# Patient Record
Sex: Female | Born: 1975 | Race: Black or African American | Hispanic: No | Marital: Single | Smoking: Never smoker
Health system: Southern US, Community
[De-identification: ages and names within clinical notes are randomized; demographics above are authoritative.]

## PROBLEM LIST (undated history)

## (undated) DIAGNOSIS — E7800 Pure hypercholesterolemia, unspecified: Secondary | ICD-10-CM

## (undated) DIAGNOSIS — E119 Type 2 diabetes mellitus without complications: Secondary | ICD-10-CM

## (undated) DIAGNOSIS — I10 Essential (primary) hypertension: Secondary | ICD-10-CM

## (undated) DIAGNOSIS — F419 Anxiety disorder, unspecified: Secondary | ICD-10-CM

## (undated) DIAGNOSIS — R509 Fever, unspecified: Secondary | ICD-10-CM

## (undated) DIAGNOSIS — E559 Vitamin D deficiency, unspecified: Secondary | ICD-10-CM

## (undated) DIAGNOSIS — G4730 Sleep apnea, unspecified: Secondary | ICD-10-CM

## (undated) DIAGNOSIS — G459 Transient cerebral ischemic attack, unspecified: Secondary | ICD-10-CM

## (undated) DIAGNOSIS — N946 Dysmenorrhea, unspecified: Secondary | ICD-10-CM

## (undated) HISTORY — PX: REDUCTION MAMMAPLASTY: SUR839

## (undated) HISTORY — PX: TUBAL LIGATION: SHX77

## (undated) HISTORY — PX: CHOLECYSTECTOMY: SHX55

---

## 2000-03-25 ENCOUNTER — Encounter: Admission: RE | Admit: 2000-03-25 | Discharge: 2000-03-25 | Payer: Self-pay | Admitting: *Deleted

## 2000-03-25 ENCOUNTER — Encounter: Payer: Self-pay | Admitting: *Deleted

## 2002-03-27 ENCOUNTER — Emergency Department (HOSPITAL_COMMUNITY): Admission: EM | Admit: 2002-03-27 | Discharge: 2002-03-27 | Payer: Self-pay | Admitting: Emergency Medicine

## 2002-03-27 ENCOUNTER — Encounter: Payer: Self-pay | Admitting: Emergency Medicine

## 2004-01-03 ENCOUNTER — Encounter: Admission: RE | Admit: 2004-01-03 | Discharge: 2004-02-01 | Payer: Self-pay | Admitting: *Deleted

## 2004-02-20 ENCOUNTER — Inpatient Hospital Stay (HOSPITAL_COMMUNITY): Admission: AD | Admit: 2004-02-20 | Discharge: 2004-02-23 | Payer: Self-pay | Admitting: *Deleted

## 2004-04-04 ENCOUNTER — Other Ambulatory Visit: Admission: RE | Admit: 2004-04-04 | Discharge: 2004-04-04 | Payer: Self-pay | Admitting: Obstetrics and Gynecology

## 2004-05-07 ENCOUNTER — Ambulatory Visit (HOSPITAL_COMMUNITY): Admission: RE | Admit: 2004-05-07 | Discharge: 2004-05-07 | Payer: Self-pay | Admitting: Obstetrics and Gynecology

## 2006-10-24 ENCOUNTER — Inpatient Hospital Stay (HOSPITAL_COMMUNITY): Admission: EM | Admit: 2006-10-24 | Discharge: 2006-10-27 | Payer: Self-pay | Admitting: Emergency Medicine

## 2006-10-26 ENCOUNTER — Encounter (INDEPENDENT_AMBULATORY_CARE_PROVIDER_SITE_OTHER): Payer: Self-pay | Admitting: Specialist

## 2006-10-29 ENCOUNTER — Ambulatory Visit: Payer: Self-pay | Admitting: Internal Medicine

## 2008-09-19 ENCOUNTER — Emergency Department (HOSPITAL_COMMUNITY): Admission: EM | Admit: 2008-09-19 | Discharge: 2008-09-19 | Payer: Self-pay | Admitting: Emergency Medicine

## 2010-12-14 NOTE — Op Note (Signed)
[name], Jennifer Bean              ACCOUNT NO.:  [id number]   MEDICAL RECORD NO.:  [id number]          PATIENT TYPE:  AMB   LOCATION:  SDC                           FACILITY:  [facility]   PHYSICIAN:  Duke Salvia. Marcelle Overlie, M.D.DATE OF BIRTH:  [birth year]   DATE OF PROCEDURE:  05/07/2004  DATE OF DISCHARGE:                                 OPERATIVE REPORT   PREOPERATIVE DIAGNOSIS:  Requests permanent sterilization.   POSTOPERATIVE DIAGNOSIS:  Requests permanent sterilization.   PROCEDURE:  Open laparoscopy, Filshie clip application.   SURGEON:  Duke Salvia. Marcelle Overlie, M.D.   ANESTHESIA:  General endotracheal.   COMPLICATIONS:  None.   DRAINS:  In-and-out Foley catheter.   ESTIMATED BLOOD LOSS:  Less than 5 mL.   PROCEDURE AND FINDINGS:  The patient was taken to the operating room.  After  an adequate level of general endotracheal anesthesia was obtained with the  patient's legs in stirrups, the abdomen, perineum, and vagina were prepped  and draped in the usual manner for laparoscopy.  The bladder was drained,  EUA carried out.  The uterus was midposition, upper limits of normal size.  Adnexa negative.  A Hulka tenaculum was positioned, attention directed to  the abdomen, where 0.25% Marcaine plain was infiltrated in the subumbilical  area.  This patient had a prior umbilical hernia repair as a child.  The  subumbilical area was tented up between two Allis clamps.  A small incision  was made.  Hemostats were then used to isolate the fascia using [employer]  retractors.  This was entered and separated until the peritoneum could be  identified, which was entered sharply without difficulty.  The open  laparoscopic cannula was inserted and insufflation was then carried out with  excellent visualization.  The patient placed in Trendelenburg, the pelvic  contents as follows.   The uterus was still upper limit of normal size.  Both ovaries were  unremarkable, as were the tubes.  The cul-de-sac  was free and clear.  Marcaine 0.25% plain 4-5 mL was then dripped across the tube from the cornu  to the fimbriated end.  The Filshie clip applicator was back loaded, a clip  was applied at a right angle 2 cm from the cornu on the right side with  excellent application.  The exact same repeated on the opposite side.  After  the clips were removed, the areas were photo documented and reinspected.  The tubes had been traced out to the fimbriated end on either side for  positive identification prior to clip application.  These areas were  hemostatic, instruments removed, gas allowed to escape.  The fascia closed  with 2-0 Vicryl interrupted sutures, a 4-0 Vicryl Rapide used on the skin in  subcuticular fashion.  She tolerated this well and went to the recovery room  in good condition.      RMH/MEDQ  D:  05/07/2004  T:  05/07/2004  Job:  [id number]

## 2010-12-14 NOTE — Discharge Summary (Signed)
NAMEANDEE, Jennifer Bean              ACCOUNT NO.:  [id number]   MEDICAL RECORD NO.:  [id number]          PATIENT TYPE:  INP   LOCATION:  5727                         FACILITY:  [facility]   PHYSICIAN:  Wilhemina Bonito. Marina Goodell, MD      DATE OF BIRTH:  1976-03-19   DATE OF ADMISSION:  10/24/2006  DATE OF DISCHARGE:  10/27/2006                               DISCHARGE SUMMARY   ADMITTING DIAGNOSES:  1. Biliary colic with gallstone pancreatitis and cholelithiasis.  Rule      out choledocholithiasis.  2. Gravida 4, para 4, all vaginal deliveries.  3. Status post umbilical hernia repair at age 46.  43. Status post tubal ligation around 2006.  5. Obesity.   DISCHARGE DIAGNOSES:  1. Biliary pancreatitis.  Symptoms resolved quickly.  2. Status post laparoscopic cholecystectomy with intraoperative      cholangiogram.  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   CONSULTATIONS:  1. With Dr. Cyndia Bent for general surgery.   PROCEDURES:  Laparoscopic cholecystectomy with intraoperative  cholangiogram.  The patient did not undergo an ERCP during this  admission.   BRIEF HISTORY:  Jennifer Bean is a pleasant 35 year old African American  female who presented to the [facility] emergency room, because of 5 days  of progressive epigastric and right upper quadrant pain associated with  nausea and dry heaves.  She had had at least three other episodes  similar to this, but they resolved after just a day or 2-3 days.  Symptoms were refractory to home use of antacids, and she came to the  emergency room for further evaluation.  A ultrasound was obtained that  showed gallstones and a common bile duct of 7 mm.  Her total bilirubin  was 3.9, alkaline phosphatase was 145, AST was 156 and ALT was 552.  Her  lipase was 3058 in the emergency room.  White count was normal, and a  basic metabolic profile was essentially normal, except for an elevated  glucose at 123.   The patient was  evaluated by [facility] and admitted to Dr. Verlee Monte  Brodie's service for observation and possible need for ERCP and  ultimately for surgical referral to have her gallbladder removed.  The  patient was stable and afebrile in the emergency room, and belly pain  was significantly improved following administration of Dilaudid.   LABS:  Sodium 139, potassium 3.9, chloride 105.  Carbon dioxide 29.  Glucose ranging 107-135.  BUN 5, creatinine 1.0.  Hemoglobin 15.1,  hematocrit 45.8.  White blood cell count 6.6.  MCV 81.  Platelets 335.  The GFR was consistently greater than 60.  Total bilirubin initially 3.9  on final assay.  Three days later it was 1.2.  Lipase went from 353,058-  55.  Alkaline phosphatase went from 145-106.  AST went from 156-63.  ALT  went from 552-302.  Albumin at discharge 3.1.  Total protein 6.0 at  discharge.  Urinalysis was an essentially contaminated specimen with  many epithelial cells present, but only 3-6 white blood cells, 0-2 red  blood cells and a few  bacteria.  She did have a few Trichomonas present  on urinalysis.   IMAGING STUDIES:  Ultrasound of the abdomen and of October 19, 2006 showed  cholelithiasis but no evidence for cholecystitis.  The common bile duct  was 7 mm, representing a mild borderline dilation of the common duct.  The pancreas imaging was limited by bowel gas.  There was no ascites.  Intraoperative cholangiogram showed no evidence for filling defect or  stricture.   HOSPITAL COURSE:  1. Cholelithiasis.  Patient was tolerating solid food at the time of      discharge.  2. Question of choledocholithiasis.  Her LFTs were initially elevated,      raising the question of the presence of a common bile duct stone.      However, on serial assay, the LFTs came close to normalizing, and      it was felt that she had probably passed a common bile duct stone.      Her symptomatology of abdominal pain and nausea/vomiting did      improve, although she was  a bit sore following the surgery.  3. Hyperglycemia.  The patient is obese.  Her blood sugars were      elevated.  We did not check a hemoglobin A1c, but in future it may      be warranted to check this to measure this, as she certainly looks      to be at risk for developing type 2 diabetes mellitus.  4. Biliary pancreatitis.  This was evidenced by the elevated lipase,      when she got to the emergency room.  Pancreas was not well      visualized on ultrasound.  Because her symptoms improved so      rapidly, it was not felt necessary to pursue a CT scan of the      abdomen to visualize the pancreas.  The lipase rapidly normalized.      Cause of the pancreatitis was certainly a gallstone.      Intraoperative cholangiogram again did show no evidence for      retained stones or sludge.   CONDITION ON DISCHARGE:  Stable and improved.   MEDICATIONS:  At discharge:  1. Tylenol one p.o. q.4 h as needed for pain.  The patient was advised      to call Dr. Tenna Child office and was given his phone number.  She      was to set up by a return to the office for 3 weeks from discharge.      She did not need to follow up with GI.   DIET:  Regular.  She is to avoid concentrated sweets.      Jennifer Moccasin, PA-C      Wilhemina Bonito. Marina Goodell, MD  Electronically Signed    SG/MEDQ  D:  10/27/2006  T:  10/27/2006  Job:  [id number]   cc:   Currie Paris, M.D.  Hedwig Morton. Juanda Chance, MD

## 2010-12-14 NOTE — Consult Note (Signed)
NAMEMARYTZA, Jennifer Bean              ACCOUNT NO.:  [id number]   MEDICAL RECORD NO.:  [id number]          PATIENT TYPE:  INP   LOCATION:  1826                         FACILITY:  [facility]   PHYSICIAN:  Currie Paris, M.D.DATE OF BIRTH:  [birth year]   DATE OF CONSULTATION:  10/24/2006  DATE OF DISCHARGE:                                 CONSULTATION   REQUESTING PHYSICIAN:  Everardo Beals. Juanda Chance, MD   SURGEON:  Currie Paris, M.D.   REASON FOR CONSULTATION:  Choledocholithiasis with associated biliary  pancreatitis.   HISTORY OF PRESENT ILLNESS:  Jennifer Bean is a 35 year old female patient  with a history of episodic right upper quadrant, abdominal pain x3  episodes; the first episode was in October 2007.  At that time the  duration of those symptoms were 2 days.  The onset of the symptoms were  nocturnal in nature; and awakened the patient from sleep.  She describes  the pain as being 6/10.  She received eventual relief from this pain  after taking Mylicon, Rolaids, and Coca-Cola.  Her most recent symptoms  started Monday, of this week; and by yesterday evening, Thursday night,  the patient had very severe symptoms; was unable to sleep; and was  unable to position self comfortably.  She presented to the ER this  morning because pain continued.  An ultrasound demonstrated gallstones  without acute cholecystitis, borderline ductal dilatation, with a  question of a distal common bile duct stone.  Labs revealed  transaminitis with a total bilirubin of 3.9.  LFTs were also elevated.  Total bilirubin elevated at 3.9 and AST and ALT were also elevated.  Lipase was 3058.  Gastroenterology was called to see the patient; and  they have admitted the patient with a diagnosis of biliary pancreatitis.  Because of the underlying gallstones, surgical evaluation has been  requested.   REVIEW OF SYSTEMS:  The patient has no experiencing nausea and vomiting  at home.  No fevers, no diarrhea.   SOCIAL HISTORY:  No alcohol.  No tobacco. No illegal drugs.  She works  as a Art gallery manager.   PAST MEDICAL HISTORY:  None.   PAST SURGICAL HISTORY:  Tubal ligation and an umbilical hernia repair at  age 57.   ALLERGIES:  NKDA.   CURRENT MEDICATIONS:  None at home.  Since admission she had been  started on Dilaudid, Phenergan, Unasyn, and Vistaril.   PHYSICAL EXAMINATION:  GENERAL:  Female patient with complaints of  nausea and severe right upper quadrant abdominal pain.  VITAL SIGNS:  Temperature 97.5, BP 132/88, pulse 70 and regular,  respirations 20.  NEURO:  The patient is alert and oriented x3 moving all extremities x4  without focal deficits.  HEENT:  Head normocephalic sclera noninjected.  NECK:  Supple.  No adenopathy.  CHEST:  She has bilateral lung sounds, clear to auscultation,  respiratory effort is nonlabored.  She is on room air.  CARDIAC:  S1-S2 no rubs, murmurs, thrills, or gallops.  No JVD, pulse  regular.  ABDOMEN:  Obese and soft, tender, somewhat in the right lower quadrant;  and  more so in the right upper quadrant which is exquisite in nature  with guarding and no rebounding.  EXTREMITIES:  Symmetrical in appearance without edema, cyanosis, or  clubbing.   LAB:  White count 6600, neutrophils 63%, hemoglobin 15.1, platelets  335,000.  Sodium 137, potassium 3.9, CO2 26, glucose 123, BUN 5,  creatinine 1.0, total bilirubin 3.9, alkaline phosphatase 165, AST 156,  ALT 552, lipase 3058.   DIAGNOSTICS:  Ultrasound of the abdomen is noted.   IMPRESSION:  1. Biliary colic with associated cholelithiasis.  2. Choledocholithiasis and associated transaminitis.  3. Biliary pancreatitis.  4. Nausea, vomiting, and volume depletion.   PLAN:  1. Agree with allowing clears, n.p.o. if lipase increases or diet      causes increased pain.  Agree also with IV fluids, empiric Unasyn,      and pain management.  2. The patient is having frequent breakthrough pain  despite Dilaudid      dosage ordered.  We will go ahead and change her Dilaudid to PCA      and add Toradol IV, Protonix while on Toradol.  3. Check CMET, CBC, and lipase in the morning.  GI is also checking      labs this evening.  4. Surgical a GI team need to discuss timing of surgical intervention      We usually prefer to allow pancreatitis to cool down, before      proceeding.  Obviously if total bilirubin begins to increase and/or      lipase increases with increasing pain, then ERCP may be indicated      first.      Revonda Standard L. Rennis Harding, N.P.      Currie Paris, M.D.  Electronically Signed    ALE/MEDQ  D:  10/24/2006  T:  10/24/2006  Job:  [id number]

## 2010-12-14 NOTE — H&P (Signed)
Jennifer Bean, Jennifer Bean              ACCOUNT NO.:  [id number]   MEDICAL RECORD NO.:  [id number]          PATIENT TYPE:  AMB   LOCATION:  SDC                           [facility]   PHYSICIAN:  Duke Salvia. Marcelle Overlie, M.D.DATE OF BIRTH:  12-05-1975   DATE OF ADMISSION:  05/07/2004  DATE OF DISCHARGE:                                HISTORY & PHYSICAL   CHIEF COMPLAINT:  Request permanent sterilization.   HISTORY OF PRESENT ILLNESS:  A 35 year old, G4 now P4 is now 9 weeks  postpartum who presents for LTC.  The permanence of the procedure, failure  rate of 2-09/998, other risks relative to bleeding, infection, adjacent  organ injury and the possible need for open or additional surgery all  discussed with her and she understands and accepts.   PAST MEDICAL HISTORY:  Allergies none.   OBSTETRIC AL HISTORY:  Four vaginal deliveries at term, the last was  complicated by insulin dependent diabetes with good control.   REVIEW OF SYMPTOMS:  Significant for history of leiomyoma, umbilical hernia  repair at age 9.   FAMILY HISTORY:  Significant for a sister with chronic hypertension  otherwise negative.   PHYSICAL EXAMINATION:  VITAL SIGNS:  Temperature 98.2, blood pressure  120/82.  HEENT:  Unremarkable.  NECK:  Supple without masses.  LUNGS:  Clear.  CARDIOVASCULAR:  Regular rate and rhythm without murmurs, rubs or gallops.  BREASTS:  Without masses.  ABDOMEN:  Soft, flat and nontender.  PELVIC:  Normal external genitalia, __________ cervix clear.  A Pap was done  which was class 1. She was recently treated with a 2 g stat dose of Flagyl  for Trichomonas noted on a Pap smear. Adnexa otherwise unremarkable.  EXTREMITIES:  Unremarkable.  NEUROLOGIC:  Unremarkable.   IMPRESSION:  Request permanent sterilization.   PLAN:  Tubal ligation by Filshie clip __________, open laparoscopy.  The  procedure and risks reviewed as above.      RMH/MEDQ  D:  05/04/2004  T:  05/04/2004  Job:   [id number]

## 2010-12-14 NOTE — Op Note (Signed)
Jennifer Bean, Jennifer Bean              ACCOUNT NO.:  [id number]   MEDICAL RECORD NO.:  [id number]          PATIENT TYPE:  INP   LOCATION:  5727                         FACILITY:  [facility]   PHYSICIAN:  Currie Paris, M.D.DATE OF BIRTH:  [birth year]   DATE OF PROCEDURE:  10/26/2006  DATE OF DISCHARGE:                               OPERATIVE REPORT   PREOPERATIVE DIAGNOSIS:  Chronic calculus cholecystitis with a recent  biliary pancreatitis.   POSTOPERATIVE DIAGNOSIS:  Chronic calculus cholecystitis with a recent  biliary pancreatitis.   OPERATION:  Laparoscopic cholecystectomy with operative cholangiogram.   SURGEON:  Currie Paris, M.D.   ASSISTANT:  Ollen Gross. Vernell Morgans, M.D.   ANESTHESIA:  General.   CLINICAL HISTORY:  Jennifer Bean is a 35 year old lady who presented to the  emergency room and was admitted on 03/28 with acute biliary  pancreatitis.  Her symptoms and pancreatic function tests returned to  normal rather quickly.  She was found to have gallstones on her  ultrasound.  She elected to proceed to cholecystectomy today.   DESCRIPTION OF PROCEDURE:  The patient was seen in the holding area and  she had no further questions.  She was taken to the operating room and  after satisfactory general endotracheal anesthesia had been obtained the  abdomen was prepped and draped.  The time-out occurred.   Then 0.25% plain Marcaine was used for each incision.  The umbilical  incision was made, the fascia opened, and the peritoneal cavity entered.  A pursestring was placed; the Hasson introduced and the abdomen  insufflated to 15.  There were no gross abnormalities.  There is a  little edema around the gallbladder, but this was really minimal.  There  is no evidence of any acute pancreatitis-type changes such as  calcifications, etcetera.   The patient was put in reverse Trendelenburg and tilted to the left.  A  10/11 trocar was placed under direct vision in the epigastrium  and two 5  mm laterally. The gallbladder was grasped and retracted over the liver.  Multiple omental adhesions were taken down.  The perineum over the  triangle of Fallot was opened; and I could identify what appeared to be  the artery; and the duct.  With a long segment duct, I could see it  clearly entering into the gallbladder; and down towards the common duct,  although we did not dissect it too far down towards the common duct. I  put one clip on what appeared to be each branch of the artery; and  another clip on the cystic duct at the junction with the gallbladder.   The cystic duct was opened.  A Cook catheter was placed percutaneously  and placed into the cystic duct for cholangiography.  This was  accomplished and appeared normal with good filling of the duodenum and  common duct with no evidence of any filling defects.  The hepatic  radicals appeared normal.  The cystic duct catheter was removed; and  three clips placed on the stay side of the cystic duct.  Additional  clips were placed on the cystic arteries  and they were divided.  Another  branch of the artery, further up, was also clipped and divided.   The gallbladder was removed from below-to-above.  It was placed in a bag  and brought out the umbilical port. We rechecked for hemostasis; and  everything appeared to be dry.  The umbilical port site was closed with  the pursestring.  The lateral ports were removed.  The epigastric port  was used to decompress the abdomen and then removed.  Skin was closed  with 4-0 Monocryl subcuticular plus Dermabond.  The patient tolerated  the procedure well.  There no operative complications.  All counts were  correct.      Currie Paris, M.D.  Electronically Signed     CJS/MEDQ  D:  10/26/2006  T:  10/26/2006  Job:  [id number]   cc:   Hedwig Morton. Juanda Chance, MD

## 2010-12-14 NOTE — H&P (Signed)
Jennifer Bean, LISA              ACCOUNT NO.:  [id number]   MEDICAL RECORD NO.:  [id number]          PATIENT TYPE:  EMS   LOCATION:  MAJO                         FACILITY:  [facility]   PHYSICIAN:  Hedwig Morton. Juanda Chance, MD     DATE OF BIRTH:  Jan 29, 1976   DATE OF ADMISSION:  10/24/2006  DATE OF DISCHARGE:                              HISTORY & PHYSICAL   GYN PHYSICIANS:  Drs. Hoffman and Crawford in [place].   She has been seen as well at the [facility] for primary care  but is not actively cared for at that clinic currently.   CHIEF COMPLAINT:  Upper abdominal pain and nausea.   HISTORY OF PRESENT ILLNESS:  The patient is seen in the minor side of  the emergency room today because of a 5-day history of epigastric and  right upper quadrant pain.  She has had nausea and dry heaves.  She has  not had any fever or chills.  She is a 35 year old obese African  American female.   In the emergency room, her LFTs are elevated.  These will be listed  below.  Her lipase is elevated, also listed below.  An ultrasound is  showing gallstones.  Mild dilation of the bile duct at 7 mm and no  evidence for cholecystitis.  They do not see the pancreas well and do  not see stones in the bile duct.  Although she says she has been dry  heaving at home, during this interview she did proceed to have an  episode of bilious emesis.   At home, she has been on simethicone, Maalox, and Rolaids, with no  relief of her symptoms.  She does say that she has had a couple of these  episodes several months ago similar to this; one that lasted a couple of  days in the other lasted about 3 or 4 days but she did not seek medical  attention for these.   ALLERGIES:  None known.   MEDICATIONS:  Adipex, this is phentermine.  She has been using this  p.r.n. as a weight loss aid but she really is not using it much.  She  has not used it in the past couple weeks.   PAST MEDICAL HISTORY:  1. Gravida 4, para 4,  vaginal deliveries.  2. Status post umbilical hernia repair, age 78.  3. Status post tubal ligation 2 years ago.  4. Obesity.   SOCIAL HISTORY:  The patient is a single mother.  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  The patient lives with three  of her four children who are age 21 to 51 in [place].  The fourth  child lives with the father.  She drinks alcohol, maybe twice a month,  either a single [place] iced tea cocktail or two to three beers as a  maximum intake when she does drink.  She has not been drinking recently  within the last 10 days.   FAMILY HISTORY:  Her mom, as well as all of her sisters, which she said  were 3 or  4 sisters, have had gallbladder disease and had to have their  gallbladders removed.   REVIEW OF SYSTEMS:  She is experiencing pruritus on her limbs and trunk.  She denies bleeding disorder.  Her last menstrual period was in January  and her periods are irregular.  She denies dysmenorrhea.  Her weight is  relatively stable but she is overweight.  She denies history of  hypertension, headaches, or tingling or numbness.  UROLOGIC:  Urine is a  bit darker than usual.  Otherwise, her review of systems is negative.   LABORATORY:  Total bilirubin is 3.9, alkaline phosphatase is 145, AST  156, ALT 552.  Lipase is 3.058.  Sodium 137, potassium 3.9.  Chloride  104, CO2 26, BUN 9, creatinine 1.  Glucose 123.  Hemoglobin 15.1,  hematocrit 45.8.  White blood cell count 6.6.  Platelets 335,000.  The  MCV is 81.   EXAM:  Blood pressure 124/86, pulse 73, respirations 22, room air  saturation 98% on room air.  Weight has not yet been obtained.  The  patient is a pleasant, obese African-American female in no distress.  She did proceed to vomit bilious material during the exam.  HEENT EXAM:  Sclerae are nonicteric.  Conjunctivae are not pale.  Extraocular movements are intact.  Oropharynx is clear and moist.  NECK:  Is without JVD, masses or  thyromegaly.  PULMONARY EXAM:  Clear to auscultation and percussion bilaterally.  No  cough.  No shortness of breath.  CARDIOVASCULAR:  Regular rate and rhythm.  No murmurs, rubs or gallops.  GI EXAM:  Is with a soft abdomen, nontender, obese.  The epigastrium and  the right upper quadrant are mildly tender but no guarding or rebound.  Note that she has had a couple of mg of Dilaudid a couple of hours ago  and her pain has abated at the time of this exam.  There are no masses,  no hepatosplenomegaly.  NEUROLOGIC:  No tremor.  She moves all four.  PSYCHIATRIC:  The patient is pleasant and appropriate.  She is a good  historian.  She seems a little bit anxious.  DERMATOLOGIC:  There is no scleral icterus but she is [place] American  so this is difficult to detect.   ASSESSMENT:  Biliary colic with what looks like gallstone pancreatitis  and cholelithiasis, rule out choledocholithiasis.  This is actually a  third episode of similar symptoms.  She may have passed a gallstone  already.   PLAN:  The patient being admitted for antibiotics, IV fluids, and repeat  of serial labs.  Will allow her clear p.o. intake.  Possible ERCP in the  morning versus a CT scan in the morning, depending on her clinical  status.      [name], PA-C      Hedwig Morton. Juanda Chance, MD  Electronically Signed    SG/MEDQ  D:  10/24/2006  T:  10/24/2006  Job:  [id number]

## 2011-04-14 ENCOUNTER — Emergency Department (HOSPITAL_COMMUNITY)
Admission: EM | Admit: 2011-04-14 | Discharge: 2011-04-14 | Disposition: A | Discharge status: Home / Self Care | Payer: Medicaid Other | Attending: Emergency Medicine | Admitting: Emergency Medicine

## 2011-04-14 DIAGNOSIS — I10 Essential (primary) hypertension: Secondary | ICD-10-CM | POA: Insufficient documentation

## 2011-04-14 DIAGNOSIS — J069 Acute upper respiratory infection, unspecified: Secondary | ICD-10-CM | POA: Insufficient documentation

## 2012-07-29 DIAGNOSIS — G459 Transient cerebral ischemic attack, unspecified: Secondary | ICD-10-CM

## 2012-07-29 HISTORY — DX: Transient cerebral ischemic attack, unspecified: G45.9

## 2014-04-25 ENCOUNTER — Telehealth: Payer: Self-pay | Admitting: General Practice

## 2014-04-25 NOTE — Telephone Encounter (Signed)
[facility] Case worker called on behalf of pt to schedule a new patient appt.  Caseworker states pt called to schedule an appt but was told to call back in the morning for a new patient slot.  Case worker states pt was at [facility] over the weekend and needs to f/u, there is no encounter listed in chart since 2012 so asked to relay to pt to bring discharge paperwork/ any medical records she has with her to her appointment which would be scheduled next week on Tuesday (05/03/14).  Caseworker states pt needs to see a Dr this week preferably tomorrow (04/26/14) and that another clinic would probably be able to schedule her sooner. Caller was advised pt can call back if still needs appt.

## 2015-01-18 ENCOUNTER — Other Ambulatory Visit: Payer: Self-pay | Admitting: Internal Medicine

## 2015-01-18 DIAGNOSIS — Z1231 Encounter for screening mammogram for malignant neoplasm of breast: Secondary | ICD-10-CM

## 2015-01-23 ENCOUNTER — Ambulatory Visit
Admission: RE | Admit: 2015-01-23 | Discharge: 2015-01-23 | Disposition: A | Discharge status: Home / Self Care | Payer: Medicaid Other | Source: Ambulatory Visit | Attending: Internal Medicine | Admitting: Internal Medicine

## 2015-01-23 DIAGNOSIS — Z1231 Encounter for screening mammogram for malignant neoplasm of breast: Secondary | ICD-10-CM

## 2015-03-16 ENCOUNTER — Encounter (HOSPITAL_COMMUNITY): Payer: Self-pay | Admitting: Cardiology

## 2015-03-16 ENCOUNTER — Emergency Department (HOSPITAL_COMMUNITY)
Admission: EM | Admit: 2015-03-16 | Discharge: 2015-03-16 | Disposition: A | Discharge status: Home / Self Care | Payer: Medicaid Other | Attending: Emergency Medicine | Admitting: Emergency Medicine

## 2015-03-16 DIAGNOSIS — Y9289 Other specified places as the place of occurrence of the external cause: Secondary | ICD-10-CM | POA: Diagnosis not present

## 2015-03-16 DIAGNOSIS — Y998 Other external cause status: Secondary | ICD-10-CM | POA: Insufficient documentation

## 2015-03-16 DIAGNOSIS — H9192 Unspecified hearing loss, left ear: Secondary | ICD-10-CM | POA: Diagnosis not present

## 2015-03-16 DIAGNOSIS — Y9389 Activity, other specified: Secondary | ICD-10-CM | POA: Diagnosis not present

## 2015-03-16 DIAGNOSIS — H9202 Otalgia, left ear: Secondary | ICD-10-CM | POA: Diagnosis present

## 2015-03-16 DIAGNOSIS — T162XXA Foreign body in left ear, initial encounter: Secondary | ICD-10-CM

## 2015-03-16 DIAGNOSIS — X58XXXA Exposure to other specified factors, initial encounter: Secondary | ICD-10-CM | POA: Diagnosis not present

## 2015-03-16 DIAGNOSIS — I10 Essential (primary) hypertension: Secondary | ICD-10-CM | POA: Diagnosis not present

## 2015-03-16 HISTORY — DX: Essential (primary) hypertension: I10

## 2015-03-16 NOTE — Discharge Instructions (Signed)
Read the information below.  You may return to the Emergency Department at any time for worsening condition or any new symptoms that concern you.   Ear Foreign Body An ear foreign body is an object that is stuck in the ear. It is common for young children to put objects into the ear canal. These may include pebbles, beads, beans, and any other small objects which will fit. In adults, objects such as cotton swabs may become lodged in the ear canal. In all ages, the most common foreign bodies are insects that enter the ear canal.  SYMPTOMS  Foreign bodies may cause pain, buzzing or roaring sounds, hearing loss, and ear drainage.  HOME CARE INSTRUCTIONS   Keep all follow-up appointments with your caregiver as told.  Keep small objects out of reach of young children. Tell them not to put anything in their ears. SEEK IMMEDIATE MEDICAL CARE IF:   You have bleeding from the ear.  You have increased pain or swelling of the ear.  You have reduced hearing.  You have discharge coming from the ear.  You have a fever.  You have a headache. MAKE SURE YOU:   Understand these instructions.  Will watch your condition.  Will get help right away if you are not doing well or get worse. Document Released: 07/12/2000 Document Revised: 10/07/2011 Document Reviewed: 03/02/2008 [facility] Patient Information 2015 [place], [place]. This information is not intended to replace advice given to you by your health care provider. Make sure you discuss any questions you have with your health care provider.

## 2015-03-16 NOTE — ED Notes (Signed)
Pt reports that she thinks a piece of q-tip is stuck in her left ear.

## 2015-03-16 NOTE — ED Provider Notes (Signed)
CSN: [id number]     Arrival date & time 03/16/15  1157 History  This chart was scribed for non-physician practitioner Clayton Bibles, PA-C, working with Virgel Manifold, MD by Zola Button, ED Scribe. This patient was seen in room TR10C/TR10C and the patient's care was started at 1:05 PM.      Chief Complaint  Patient presents with  . Ear Pain   The history is provided by the patient. No language interpreter was used.   HPI Comments: Jennifer Bean is a 39 y.o. female who presents to the Emergency Department complaining of a possible piece of Q-tip in her left ear as of 2 hours ago. Patient reports having associated decreased hearing and ear fullness. She was cleaning her ears this morning and notes that a piece of the Q-tip did not come out, and she could not find the piece anywhere around her. PMHx includes hypertension.  Denies fevers, chills, nasal congestion, rhinorrhea, sore throat, headache, ear discharge, cough, SOB.    Past Medical History  Diagnosis Date  . Hypertension    History reviewed. No pertinent past surgical history. History reviewed. No pertinent family history. Social History  Substance Use Topics  . Smoking status: Never Smoker   . Smokeless tobacco: None  . Alcohol Use: Yes   OB History    No data available     Review of Systems  Constitutional: Negative for fever and chills.  HENT: Positive for ear pain and hearing loss. Negative for congestion, dental problem, ear discharge, facial swelling, mouth sores, rhinorrhea, sore throat and trouble swallowing.   Eyes: Negative for discharge.  Respiratory: Negative for cough, shortness of breath, wheezing and stridor.   Cardiovascular: Negative for chest pain.  Musculoskeletal: Negative for neck pain and neck stiffness.  Allergic/Immunologic: Negative for immunocompromised state.      Allergies  Review of patient's allergies indicates no known allergies.  Home Medications   Prior to Admission medications   Not  on File   BP 128/87 mmHg  Pulse 94  Temp(Src) 98.1 F (36.7 C) (Oral)  Resp 18  SpO2 99%  LMP 03/15/2015 Physical Exam  Constitutional: She appears well-developed and well-nourished. No distress.  HENT:  Head: Normocephalic and atraumatic.  Right Ear: Tympanic membrane and ear canal normal.  Left Ear: Tympanic membrane normal. A foreign body is present.  Single large foreign body consistent with Q-tip cotton in left ear canal.  Neck: Neck supple.  Pulmonary/Chest: Effort normal.  Neurological: She is alert.  Skin: She is not diaphoretic.  Nursing note and vitals reviewed.   ED Course  FOREIGN BODY REMOVAL Date/Time: 03/16/2015 1:46 PM Performed by: Clayton Bibles Authorized by: Clayton Bibles Consent: Verbal consent obtained. Consent given by: patient Patient identity confirmed: verbally with patient Body area: ear Location details: left ear Patient sedated: no Patient restrained: no Patient cooperative: yes Localization method: ENT speculum and visualized Removal mechanism: alligator forceps Complexity: simple 1 objects recovered. Objects recovered: Qtip cotton Post-procedure assessment: foreign body removed Patient tolerance: Patient tolerated the procedure well with no immediate complications Comments: Ear canal clear and TM normal after FB removal.  Pt reports complete relief.     DIAGNOSTIC STUDIES: Oxygen Saturation is 99% on room air, normal by my interpretation.    COORDINATION OF CARE: 1:14 PM-Discussed treatment plan which includes foreign object removal with patient/guardian at bedside and patient/guardian agreed to plan.    Labs Review Labs Reviewed - No data to display  Imaging Review No results found. I have  personally reviewed and evaluated these images and lab results as part of my medical decision-making.   EKG Interpretation None      MDM   Final diagnoses:  Ear foreign body, left, initial encounter    Afebrile, nontoxic patient with  Qtip cotton stuck in left ear canal x 2-3 hours.  Removed in ED by me with complete relief.  No apparent canal infection.   D/C home with PCP follow up, prn.  Discussed result, findings, treatment, and follow up  with patient.  Pt given return precautions.  Pt verbalizes understanding and agrees with plan.       I personally performed the services described in this documentation, which was scribed in my presence. The recorded information has been reviewed and is accurate.    Clayton Bibles, PA-C 03/16/15 [place], MD 03/17/15 [phone]

## 2015-12-18 ENCOUNTER — Other Ambulatory Visit: Payer: Self-pay

## 2015-12-18 DIAGNOSIS — Z1231 Encounter for screening mammogram for malignant neoplasm of breast: Secondary | ICD-10-CM

## 2016-01-24 ENCOUNTER — Ambulatory Visit
Admission: RE | Admit: 2016-01-24 | Discharge: 2016-01-24 | Disposition: A | Discharge status: Home / Self Care | Payer: Medicaid Other | Source: Ambulatory Visit

## 2016-01-24 ENCOUNTER — Other Ambulatory Visit: Payer: Self-pay

## 2016-01-24 DIAGNOSIS — Z1231 Encounter for screening mammogram for malignant neoplasm of breast: Secondary | ICD-10-CM

## 2016-04-06 ENCOUNTER — Other Ambulatory Visit: Payer: Self-pay | Admitting: Physician Assistant

## 2016-04-06 DIAGNOSIS — N941 Unspecified dyspareunia: Secondary | ICD-10-CM

## 2016-04-12 ENCOUNTER — Other Ambulatory Visit: Payer: Medicaid Other

## 2016-04-18 ENCOUNTER — Ambulatory Visit
Admission: RE | Admit: 2016-04-18 | Discharge: 2016-04-18 | Disposition: A | Discharge status: Home / Self Care | Payer: Medicaid Other | Source: Ambulatory Visit | Attending: Physician Assistant | Admitting: Physician Assistant

## 2016-04-18 DIAGNOSIS — N941 Unspecified dyspareunia: Secondary | ICD-10-CM

## 2016-06-13 ENCOUNTER — Ambulatory Visit: Payer: Medicaid Other | Admitting: Obstetrics & Gynecology

## 2016-10-18 ENCOUNTER — Other Ambulatory Visit: Payer: Self-pay | Admitting: Obstetrics & Gynecology

## 2016-10-22 NOTE — Patient Instructions (Signed)
Your procedure is scheduled on:  Monday, November 04, 2016  Enter through the [employer] [facility] at:  11:30 AM  Pick up the phone at the desk and dial [phone].  Call this number if you have problems the morning of surgery: [phone].  Remember: Do NOT eat food:  After 5:00 AM day of surgery  Do NOT drink clear liquids after:  9:00 AM day of surgery  Take these medicines the morning of surgery with a SIP OF WATER:  Amlodipine, Atorvastatin, Gabapentin, Clonazepam if needed  Do NOT take the evening dose of Metformin the night before surgery  Bring Asthma Inhaler day of surgery  Stop ALL herbal medications and Ibuprofen at this time  Do NOT smoke the day of surgery.  Do NOT wear jewelry (body piercing), metal hair clips/bobby pins, make-up, or nail polish. Do NOT wear lotions, powders, or perfumes.  You may wear deodorant. Do NOT shave for 48 hours prior to surgery. Do NOT bring valuables to the hospital. Contacts, dentures, or bridgework may not be worn into surgery.  Leave suitcase in car.  After surgery it may be brought to your room.  For patients admitted to the hospital, checkout time is 11:00 AM the day of discharge.  Bring a copy of your healthcare power of attorney and living will documents.

## 2016-10-23 ENCOUNTER — Encounter (HOSPITAL_COMMUNITY): Payer: Self-pay

## 2016-10-23 ENCOUNTER — Encounter (HOSPITAL_COMMUNITY)
Admission: RE | Admit: 2016-10-23 | Discharge: 2016-10-23 | Disposition: A | Discharge status: Home / Self Care | Payer: Medicaid Other | Source: Ambulatory Visit | Attending: Obstetrics & Gynecology | Admitting: Obstetrics & Gynecology

## 2016-10-23 DIAGNOSIS — D259 Leiomyoma of uterus, unspecified: Secondary | ICD-10-CM | POA: Diagnosis not present

## 2016-10-23 DIAGNOSIS — Z01812 Encounter for preprocedural laboratory examination: Secondary | ICD-10-CM | POA: Insufficient documentation

## 2016-10-23 DIAGNOSIS — R102 Pelvic and perineal pain: Secondary | ICD-10-CM | POA: Diagnosis not present

## 2016-10-23 HISTORY — DX: Pure hypercholesterolemia, unspecified: E78.00

## 2016-10-23 HISTORY — DX: Transient cerebral ischemic attack, unspecified: G45.9

## 2016-10-23 HISTORY — DX: Anxiety disorder, unspecified: F41.9

## 2016-10-23 HISTORY — DX: Vitamin D deficiency, unspecified: E55.9

## 2016-10-23 HISTORY — DX: Sleep apnea, unspecified: G47.30

## 2016-10-23 HISTORY — DX: Type 2 diabetes mellitus without complications: E11.9

## 2016-10-23 LAB — CBC
HCT: 39.4 % (ref 36.0–46.0)
HEMOGLOBIN: 13.1 g/dL (ref 12.0–15.0)
MCH: 26.1 pg (ref 26.0–34.0)
MCHC: 33.2 g/dL (ref 30.0–36.0)
MCV: 78.6 fL (ref 78.0–100.0)
Platelets: 359 10*3/uL (ref 150–400)
RBC: 5.01 MIL/uL (ref 3.87–5.11)
RDW: 14.9 % (ref 11.5–15.5)
WBC: 8.2 10*3/uL (ref 4.0–10.5)

## 2016-10-23 LAB — BASIC METABOLIC PANEL
Anion gap: 8 (ref 5–15)
BUN: 13 mg/dL (ref 6–20)
CHLORIDE: 103 mmol/L (ref 101–111)
CO2: 24 mmol/L (ref 22–32)
CREATININE: 0.91 mg/dL (ref 0.44–1.00)
Calcium: 9.3 mg/dL (ref 8.9–10.3)
GFR calc Af Amer: >60 mL/min (ref >60)
GFR calc non Af Amer: >60 mL/min (ref >60)
GLUCOSE: 149 mg/dL — AB (ref 65–99)
Potassium: 4 mmol/L (ref 3.5–5.1)
SODIUM: 135 mmol/L (ref 135–145)

## 2016-11-04 ENCOUNTER — Encounter (HOSPITAL_COMMUNITY)
Admission: RE | Disposition: A | Discharge status: Home / Self Care | Payer: Self-pay | Source: Ambulatory Visit | Attending: Obstetrics & Gynecology

## 2016-11-04 ENCOUNTER — Ambulatory Visit (HOSPITAL_COMMUNITY): Payer: Medicaid Other | Admitting: Anesthesiology

## 2016-11-04 ENCOUNTER — Inpatient Hospital Stay (HOSPITAL_COMMUNITY)
Admission: RE | Admit: 2016-11-04 | Discharge: 2016-11-05 | DRG: 742 | Disposition: A | Discharge status: Home / Self Care | Payer: Medicaid Other | Source: Ambulatory Visit | Attending: Obstetrics & Gynecology | Admitting: Obstetrics & Gynecology

## 2016-11-04 ENCOUNTER — Encounter (HOSPITAL_COMMUNITY): Payer: Self-pay

## 2016-11-04 DIAGNOSIS — I10 Essential (primary) hypertension: Secondary | ICD-10-CM | POA: Diagnosis present

## 2016-11-04 DIAGNOSIS — Z91040 Latex allergy status: Secondary | ICD-10-CM | POA: Diagnosis not present

## 2016-11-04 DIAGNOSIS — Z9851 Tubal ligation status: Secondary | ICD-10-CM

## 2016-11-04 DIAGNOSIS — Z7984 Long term (current) use of oral hypoglycemic drugs: Secondary | ICD-10-CM

## 2016-11-04 DIAGNOSIS — N946 Dysmenorrhea, unspecified: Secondary | ICD-10-CM | POA: Diagnosis present

## 2016-11-04 DIAGNOSIS — Z6841 Body Mass Index (BMI) 40.0 and over, adult: Secondary | ICD-10-CM | POA: Diagnosis not present

## 2016-11-04 DIAGNOSIS — Z8673 Personal history of transient ischemic attack (TIA), and cerebral infarction without residual deficits: Secondary | ICD-10-CM

## 2016-11-04 DIAGNOSIS — D259 Leiomyoma of uterus, unspecified: Secondary | ICD-10-CM | POA: Diagnosis present

## 2016-11-04 DIAGNOSIS — E119 Type 2 diabetes mellitus without complications: Secondary | ICD-10-CM | POA: Diagnosis present

## 2016-11-04 DIAGNOSIS — E282 Polycystic ovarian syndrome: Secondary | ICD-10-CM | POA: Diagnosis present

## 2016-11-04 DIAGNOSIS — N941 Unspecified dyspareunia: Secondary | ICD-10-CM | POA: Diagnosis present

## 2016-11-04 DIAGNOSIS — Z90710 Acquired absence of both cervix and uterus: Secondary | ICD-10-CM | POA: Diagnosis present

## 2016-11-04 HISTORY — DX: Dysmenorrhea, unspecified: N94.6

## 2016-11-04 HISTORY — PX: ROBOTIC ASSISTED TOTAL HYSTERECTOMY WITH SALPINGECTOMY: SHX6679

## 2016-11-04 LAB — GLUCOSE, CAPILLARY: GLUCOSE-CAPILLARY: 118 mg/dL — AB (ref 65–99)

## 2016-11-04 LAB — PREGNANCY, URINE: Preg Test, Ur: NEGATIVE

## 2016-11-04 SURGERY — ROBOTIC ASSISTED TOTAL HYSTERECTOMY WITH SALPINGECTOMY
Anesthesia: General | Site: Abdomen | Laterality: Bilateral

## 2016-11-04 MED ORDER — ATORVASTATIN CALCIUM 20 MG PO TABS
20.0000 mg | ORAL_TABLET | Freq: Every day | ORAL | Status: DC
  Filled 2016-11-04: qty 1

## 2016-11-04 MED ORDER — DEXAMETHASONE SODIUM PHOSPHATE 10 MG/ML IJ SOLN
INTRAMUSCULAR | Status: DC | PRN
  Administered 2016-11-04: 4 mg via INTRAVENOUS

## 2016-11-04 MED ORDER — ONDANSETRON HCL 4 MG/2ML IJ SOLN
INTRAMUSCULAR | Status: AC
  Filled 2016-11-04: qty 2

## 2016-11-04 MED ORDER — FENTANYL CITRATE (PF) 100 MCG/2ML IJ SOLN
INTRAMUSCULAR | Status: AC
  Filled 2016-11-04: qty 2

## 2016-11-04 MED ORDER — ROCURONIUM BROMIDE 100 MG/10ML IV SOLN
INTRAVENOUS | Status: AC
  Filled 2016-11-04: qty 1

## 2016-11-04 MED ORDER — KETOROLAC TROMETHAMINE 30 MG/ML IJ SOLN
INTRAMUSCULAR | Status: DC | PRN
  Administered 2016-11-04: 30 mg via INTRAVENOUS

## 2016-11-04 MED ORDER — PROPOFOL 10 MG/ML IV BOLUS
INTRAVENOUS | Status: AC
  Filled 2016-11-04: qty 40

## 2016-11-04 MED ORDER — ROCURONIUM BROMIDE 100 MG/10ML IV SOLN
INTRAVENOUS | Status: DC | PRN
  Administered 2016-11-04: 20 mg via INTRAVENOUS
  Administered 2016-11-04: 50 mg via INTRAVENOUS
  Administered 2016-11-04 (×2): 20 mg via INTRAVENOUS
  Administered 2016-11-04: 10 mg via INTRAVENOUS

## 2016-11-04 MED ORDER — OXYCODONE-ACETAMINOPHEN 5-325 MG PO TABS
1.0000 | ORAL_TABLET | ORAL | Status: DC | PRN
Start: 2016-11-04 — End: 2016-11-05
  Administered 2016-11-04 – 2016-11-05 (×3): 2 via ORAL
  Filled 2016-11-04 (×3): qty 2

## 2016-11-04 MED ORDER — SUGAMMADEX SODIUM 200 MG/2ML IV SOLN
INTRAVENOUS | Status: DC | PRN
  Administered 2016-11-04: 300 mg via INTRAVENOUS

## 2016-11-04 MED ORDER — ONDANSETRON HCL 4 MG/2ML IJ SOLN
4.0000 mg | Freq: Four times a day (QID) | INTRAMUSCULAR | Status: DC | PRN
  Administered 2016-11-04: 4 mg via INTRAVENOUS
  Filled 2016-11-04: qty 2

## 2016-11-04 MED ORDER — EPHEDRINE SULFATE 50 MG/ML IJ SOLN
INTRAMUSCULAR | Status: DC | PRN
  Administered 2016-11-04 (×2): 10 mg via INTRAVENOUS

## 2016-11-04 MED ORDER — AMLODIPINE BESYLATE 5 MG PO TABS
5.0000 mg | ORAL_TABLET | Freq: Every day | ORAL | Status: DC
Start: 2016-11-05 — End: 2016-11-05
  Administered 2016-11-05: 5 mg via ORAL
  Filled 2016-11-04: qty 1

## 2016-11-04 MED ORDER — PROMETHAZINE HCL 25 MG/ML IJ SOLN: 6.2500 mg | INTRAMUSCULAR | Status: DC | PRN

## 2016-11-04 MED ORDER — HYDROMORPHONE HCL 1 MG/ML IJ SOLN
INTRAMUSCULAR | Status: DC | PRN
  Administered 2016-11-04: 1 mg via INTRAVENOUS

## 2016-11-04 MED ORDER — LIDOCAINE HCL (CARDIAC) 20 MG/ML IV SOLN
INTRAVENOUS | Status: DC | PRN
  Administered 2016-11-04: 80 mg via INTRAVENOUS

## 2016-11-04 MED ORDER — CLONAZEPAM 0.5 MG PO TABS
1.0000 mg | ORAL_TABLET | Freq: Every day | ORAL | Status: DC | PRN
  Administered 2016-11-05: 1 mg via ORAL
  Filled 2016-11-04: qty 2

## 2016-11-04 MED ORDER — ROPIVACAINE HCL 5 MG/ML IJ SOLN
INTRAMUSCULAR | Status: AC
  Filled 2016-11-04: qty 30

## 2016-11-04 MED ORDER — HYDROMORPHONE HCL 1 MG/ML IJ SOLN
0.2500 mg | INTRAMUSCULAR | Status: DC | PRN
  Administered 2016-11-04 (×2): 0.5 mg via INTRAVENOUS

## 2016-11-04 MED ORDER — MIDAZOLAM HCL 2 MG/2ML IJ SOLN
INTRAMUSCULAR | Status: DC | PRN
Start: 2016-11-04 — End: 2016-11-04
  Administered 2016-11-04: 2 mg via INTRAVENOUS

## 2016-11-04 MED ORDER — KETOROLAC TROMETHAMINE 30 MG/ML IJ SOLN: 30.0000 mg | Freq: Once | INTRAMUSCULAR | Status: DC | PRN

## 2016-11-04 MED ORDER — CEFAZOLIN SODIUM-DEXTROSE 2-4 GM/100ML-% IV SOLN
2.0000 g | INTRAVENOUS | Status: AC
  Administered 2016-11-04: 2 g via INTRAVENOUS

## 2016-11-04 MED ORDER — SCOPOLAMINE 1 MG/3DAYS TD PT72
MEDICATED_PATCH | TRANSDERMAL | Status: AC
  Administered 2016-11-04: 1.5 mg via TRANSDERMAL
  Filled 2016-11-04: qty 1

## 2016-11-04 MED ORDER — ALBUTEROL SULFATE (2.5 MG/3ML) 0.083% IN NEBU: 3.0000 mL | INHALATION_SOLUTION | RESPIRATORY_TRACT | Status: DC | PRN

## 2016-11-04 MED ORDER — MIDAZOLAM HCL 2 MG/2ML IJ SOLN
INTRAMUSCULAR | Status: AC
  Filled 2016-11-04: qty 2

## 2016-11-04 MED ORDER — ONDANSETRON HCL 4 MG PO TABS: 4.0000 mg | ORAL_TABLET | Freq: Four times a day (QID) | ORAL | Status: DC | PRN

## 2016-11-04 MED ORDER — VITAMIN D3 25 MCG (1000 UNIT) PO TABS
1000.0000 [IU] | ORAL_TABLET | Freq: Every day | ORAL | Status: DC
  Administered 2016-11-05: 1000 [IU] via ORAL
  Filled 2016-11-04 (×2): qty 1

## 2016-11-04 MED ORDER — ONDANSETRON HCL 4 MG/2ML IJ SOLN
INTRAMUSCULAR | Status: DC | PRN
  Administered 2016-11-04: 4 mg via INTRAVENOUS

## 2016-11-04 MED ORDER — GABAPENTIN 300 MG PO CAPS
300.0000 mg | ORAL_CAPSULE | Freq: Three times a day (TID) | ORAL | Status: DC
  Administered 2016-11-05: 300 mg via ORAL
  Filled 2016-11-04 (×5): qty 1

## 2016-11-04 MED ORDER — METFORMIN HCL 500 MG PO TABS
500.0000 mg | ORAL_TABLET | Freq: Two times a day (BID) | ORAL | Status: DC
  Administered 2016-11-05: 500 mg via ORAL
  Filled 2016-11-04 (×4): qty 1

## 2016-11-04 MED ORDER — SODIUM CHLORIDE 0.9 % IV SOLN
INTRAVENOUS | Status: DC | PRN
  Administered 2016-11-04: 5 mL
  Administered 2016-11-04: 55 mL

## 2016-11-04 MED ORDER — LIDOCAINE HCL (CARDIAC) 20 MG/ML IV SOLN
INTRAVENOUS | Status: AC
  Filled 2016-11-04: qty 5

## 2016-11-04 MED ORDER — KETOROLAC TROMETHAMINE 30 MG/ML IJ SOLN: 30.0000 mg | Freq: Once | INTRAMUSCULAR | Status: DC

## 2016-11-04 MED ORDER — FENTANYL CITRATE (PF) 100 MCG/2ML IJ SOLN
INTRAMUSCULAR | Status: DC | PRN
  Administered 2016-11-04 (×3): 100 ug via INTRAVENOUS
  Administered 2016-11-04: 50 ug via INTRAVENOUS

## 2016-11-04 MED ORDER — LACTATED RINGERS IV SOLN
INTRAVENOUS | Status: AC
  Administered 2016-11-05: 01:00:00 via INTRAVENOUS

## 2016-11-04 MED ORDER — HYDROMORPHONE HCL 1 MG/ML IJ SOLN
INTRAMUSCULAR | Status: AC
  Filled 2016-11-04: qty 1

## 2016-11-04 MED ORDER — HYDROMORPHONE HCL 1 MG/ML IJ SOLN
INTRAMUSCULAR | Status: AC
  Administered 2016-11-04: 0.5 mg via INTRAVENOUS
  Filled 2016-11-04: qty 1

## 2016-11-04 MED ORDER — FENTANYL CITRATE (PF) 250 MCG/5ML IJ SOLN
INTRAMUSCULAR | Status: AC
  Filled 2016-11-04: qty 5

## 2016-11-04 MED ORDER — LACTATED RINGERS IR SOLN
Status: DC | PRN
  Administered 2016-11-04: 3000 mL

## 2016-11-04 MED ORDER — PROPOFOL 10 MG/ML IV BOLUS
INTRAVENOUS | Status: DC | PRN
  Administered 2016-11-04: 250 mg via INTRAVENOUS

## 2016-11-04 MED ORDER — SUGAMMADEX SODIUM 500 MG/5ML IV SOLN
INTRAVENOUS | Status: AC
  Filled 2016-11-04: qty 5

## 2016-11-04 MED ORDER — SCOPOLAMINE 1 MG/3DAYS TD PT72
1.0000 | MEDICATED_PATCH | Freq: Once | TRANSDERMAL | Status: DC
  Administered 2016-11-04: 1.5 mg via TRANSDERMAL

## 2016-11-04 MED ORDER — EPHEDRINE 5 MG/ML INJ
INTRAVENOUS | Status: AC
  Filled 2016-11-04: qty 10

## 2016-11-04 MED ORDER — LACTATED RINGERS IV SOLN
INTRAVENOUS | Status: DC
  Administered 2016-11-04: 12:00:00 via INTRAVENOUS
  Administered 2016-11-04: 125 mL/h via INTRAVENOUS
  Administered 2016-11-04: 15:00:00 via INTRAVENOUS

## 2016-11-04 MED ORDER — SODIUM CHLORIDE 0.9 % IJ SOLN
INTRAMUSCULAR | Status: AC
  Filled 2016-11-04: qty 50

## 2016-11-04 MED ORDER — KETOROLAC TROMETHAMINE 30 MG/ML IJ SOLN
INTRAMUSCULAR | Status: AC
  Filled 2016-11-04: qty 1

## 2016-11-04 MED ORDER — PHENYLEPHRINE 40 MCG/ML (10ML) SYRINGE FOR IV PUSH (FOR BLOOD PRESSURE SUPPORT)
PREFILLED_SYRINGE | INTRAVENOUS | Status: AC
  Filled 2016-11-04: qty 10

## 2016-11-04 MED ORDER — MENTHOL 3 MG MT LOZG
1.0000 | LOZENGE | OROMUCOSAL | Status: DC | PRN
Start: 2016-11-04 — End: 2016-11-05
  Filled 2016-11-04: qty 9

## 2016-11-04 SURGICAL SUPPLY — 51 items
BARRIER ADHS 3X4 INTERCEED (GAUZE/BANDAGES/DRESSINGS) IMPLANT
CATH FOLEY 3WAY  5CC 16FR (CATHETERS) ×2
CATH FOLEY 3WAY 5CC 16FR (CATHETERS) ×1 IMPLANT
CLOTH BEACON ORANGE TIMEOUT ST (SAFETY) ×3 IMPLANT
CONT PATH 16OZ SNAP LID 3702 (MISCELLANEOUS) ×3 IMPLANT
COVER BACK TABLE 60X90IN (DRAPES) ×6 IMPLANT
COVER TIP SHEARS 8 DVNC (MISCELLANEOUS) ×1 IMPLANT
COVER TIP SHEARS 8MM DA VINCI (MISCELLANEOUS) ×2
DECANTER SPIKE VIAL GLASS SM (MISCELLANEOUS) ×6 IMPLANT
DEFOGGER SCOPE WARMER CLEARIFY (MISCELLANEOUS) ×3 IMPLANT
DERMABOND ADVANCED (GAUZE/BANDAGES/DRESSINGS) ×2
DERMABOND ADVANCED .7 DNX12 (GAUZE/BANDAGES/DRESSINGS) ×1 IMPLANT
DURAPREP 26ML APPLICATOR (WOUND CARE) ×3 IMPLANT
ELECT REM PT RETURN 9FT ADLT (ELECTROSURGICAL) ×3
ELECTRODE REM PT RTRN 9FT ADLT (ELECTROSURGICAL) ×1 IMPLANT
GAUZE VASELINE 3X9 (GAUZE/BANDAGES/DRESSINGS) IMPLANT
GLOVE BIO SURGEON STRL SZ7 (GLOVE) IMPLANT
GLOVE BIOGEL PI IND STRL 7.0 (GLOVE) ×8 IMPLANT
GLOVE BIOGEL PI INDICATOR 7.0 (GLOVE) ×16
KIT ACCESSORY DA VINCI DISP (KITS) ×2
KIT ACCESSORY DVNC DISP (KITS) ×1 IMPLANT
LEGGING LITHOTOMY PAIR STRL (DRAPES) ×3 IMPLANT
OCCLUDER COLPOPNEUMO (BALLOONS) ×3 IMPLANT
PACK ROBOT WH (CUSTOM PROCEDURE TRAY) ×3 IMPLANT
PACK ROBOTIC GOWN (GOWN DISPOSABLE) ×3 IMPLANT
PACK TRENDGUARD 450 HYBRID PRO (MISCELLANEOUS) IMPLANT
PACK TRENDGUARD 600 HYBRD PROC (MISCELLANEOUS) ×1 IMPLANT
PAD PREP 24X48 CUFFED NSTRL (MISCELLANEOUS) ×6 IMPLANT
POUCH LAPAROSCOPIC INSTRUMENT (MISCELLANEOUS) ×3 IMPLANT
PROTECTOR NERVE ULNAR (MISCELLANEOUS) ×6 IMPLANT
SET CYSTO W/LG BORE CLAMP LF (SET/KITS/TRAYS/PACK) IMPLANT
SET IRRIG TUBING LAPAROSCOPIC (IRRIGATION / IRRIGATOR) ×3 IMPLANT
SET TRI-LUMEN FLTR TB AIRSEAL (TUBING) ×3 IMPLANT
SUT VICRYL 0 UR6 27IN ABS (SUTURE) ×3 IMPLANT
SUT VICRYL 4-0 PS2 18IN ABS (SUTURE) ×6 IMPLANT
SUT VLOC 180 0 9IN  GS21 (SUTURE) ×2
SUT VLOC 180 0 9IN GS21 (SUTURE) ×1 IMPLANT
SYR 50ML LL SCALE MARK (SYRINGE) ×3 IMPLANT
SYSTEM CONVERTIBLE TROCAR (TROCAR) ×3 IMPLANT
TIP RUMI ORANGE 6.7MMX12CM (TIP) IMPLANT
TIP UTERINE 5.1X6CM LAV DISP (MISCELLANEOUS) IMPLANT
TIP UTERINE 6.7X10CM GRN DISP (MISCELLANEOUS) ×3 IMPLANT
TIP UTERINE 6.7X6CM WHT DISP (MISCELLANEOUS) IMPLANT
TIP UTERINE 6.7X8CM BLUE DISP (MISCELLANEOUS) IMPLANT
TOWEL OR 17X24 6PK STRL BLUE (TOWEL DISPOSABLE) ×9 IMPLANT
TRENDGUARD 450 HYBRID PRO PACK (MISCELLANEOUS)
TRENDGUARD 600 HYBRID PROC PK (MISCELLANEOUS) ×3
TROCAR 12M 150ML BLUNT (TROCAR) ×3 IMPLANT
TROCAR DISP BLADELESS 8 DVNC (TROCAR) ×1 IMPLANT
TROCAR DISP BLADELESS 8MM (TROCAR) ×2
TROCAR PORT AIRSEAL 5X120 (TROCAR) ×3 IMPLANT

## 2016-11-04 NOTE — Anesthesia Preprocedure Evaluation (Addendum)
Anesthesia Evaluation  Patient identified by MRN, date of birth, ID band Patient awake    Reviewed: Allergy & Precautions, NPO status , Patient's Chart, lab work & pertinent test results  Airway Mallampati: III  TM Distance: >3 FB Neck ROM: Full    Dental  (+) Dental Advisory Given   Pulmonary sleep apnea ,    breath sounds clear to auscultation       Cardiovascular hypertension, Pt. on medications  Rhythm:Regular Rate:Normal     Neuro/Psych Anxiety negative neurological ROS     GI/Hepatic negative GI ROS, Neg liver ROS,   Endo/Other  diabetes, Type 2, Oral Hypoglycemic AgentsMorbid obesity  Renal/GU negative Renal ROS     Musculoskeletal   Abdominal   Peds  Hematology negative hematology ROS (+)   Anesthesia Other Findings   Reproductive/Obstetrics                            Lab Results  Component Value Date   WBC 8.2 10/23/2016   HGB 13.1 10/23/2016   HCT 39.4 10/23/2016   MCV 78.6 10/23/2016   PLT 359 10/23/2016   Lab Results  Component Value Date   CREATININE 0.91 10/23/2016   BUN 13 10/23/2016   NA 135 10/23/2016   K 4.0 10/23/2016   CL 103 10/23/2016   CO2 24 10/23/2016    Anesthesia Physical Anesthesia Plan  ASA: III  Anesthesia Plan: General   Post-op Pain Management:    Induction: Intravenous  Airway Management Planned: Oral ETT  Additional Equipment:   Intra-op Plan:   Post-operative Plan: Extubation in OR  Informed Consent: I have reviewed the patients History and Physical, chart, labs and discussed the procedure including the risks, benefits and alternatives for the proposed anesthesia with the patient or authorized representative who has indicated his/her understanding and acceptance.   Dental advisory given  Plan Discussed with: CRNA  Anesthesia Plan Comments:         Anesthesia Quick Evaluation

## 2016-11-04 NOTE — Anesthesia Procedure Notes (Signed)
Procedure Name: Intubation Date/Time: 11/04/2016 1:07 PM Performed by: Jonna Munro Pre-anesthesia Checklist: Patient identified, Emergency Drugs available, Suction available, Timeout performed and Patient being monitored Patient Re-evaluated:Patient Re-evaluated prior to inductionOxygen Delivery Method: Circle system utilized Preoxygenation: Pre-oxygenation with 100% oxygen Intubation Type: IV induction Ventilation: Mask ventilation without difficulty Laryngoscope Size: Mac and 3 Grade View: Grade I Tube type: Oral Tube size: 7.0 mm Number of attempts: 1 Airway Equipment and Method: Stylet Placement Confirmation: ETT inserted through vocal cords under direct vision,  positive ETCO2 and breath sounds checked- equal and bilateral Secured at: 21 cm Tube secured with: Tape Dental Injury: Teeth and Oropharynx as per pre-operative assessment

## 2016-11-04 NOTE — Transfer of Care (Signed)
Immediate Anesthesia Transfer of Care Note  Patient: Jennifer Bean  Procedure(s) Performed: Procedure(s): ROBOTIC ASSISTED TOTAL HYSTERECTOMY WITH SALPINGECTOMY (Bilateral)  Patient Location: PACU  Anesthesia Type:General  Level of Consciousness: awake, alert  and oriented  Airway & Oxygen Therapy: Patient Spontanous Breathing and Patient connected to nasal cannula oxygen  Post-op Assessment: Report given to RN and Post -op Vital signs reviewed and stable  Post vital signs: Reviewed and stable  Last Vitals:  Vitals:   11/04/16 1136  BP: (!) 129/91  Pulse: 96  Resp: (!) 22  Temp: 36.8 C    Last Pain:  Vitals:   11/04/16 1136  TempSrc: Oral      Patients Stated Pain Goal: 3 (48/47/20 7218)  Complications: No apparent anesthesia complications

## 2016-11-04 NOTE — Op Note (Signed)
Preoperative diagnosis: Dysmenorrhea, Pelvic pain, Fibroid uterus  Postop diagnosis: Same Procedure: da Vinci robot assisted total laparoscopic hysterectomy and bilateral salpingectomy Anesthesia Gen. Endotracheal Surgeon: Dr. Azucena Fallen Assistant: Dr Aloha Gell IV fluids: 1500 cc LR EBL: 100 cc Urine output: 034 cc, clear Complications: none Pathology: Uterus with cervix and both fallopian tubes Disposition: PACU, stable Findings: Normal uterus and ovaries and fallopian tubes. Normal appearing liver surface. Normal gross appearance of bowel and omentum  Procedure:  Indication: -- 41 yo female with severe dysmenorrhea, pelvic pain, desiring definite intervention and declined conservative options. Pelvic sono and office endometrial biopsy were clear. Options reviewed and laparoscopic hysterectomy with daVinci assistance reviewed, she agreed.  Complications of surgery including infection, bleeding, damage to internal organs and other surgery related problems including pneumonia, VTE reviewed and informed written consent was obtained.. She understood and gave informed written consent.  Patient was brought to the operating room with IV running. She received 2 gm Ancef. She underwent general anesthesia without difficulty and was given dorsal lithotomy position, prepped and draped in sterile fashion. Foley catheter was placed. Cervix was exposed with a speculum and anterior lip of the cervix was grasped with tenaculum. Uterus was sounded to 11cm. A  # 10 Rumi tip and a Medium Koh ring was assembled on the [employer] and entered in the uterine cavity and balloon was inflated to secure it in place. Koh ring was palpated again cervico-vaginal junction. Speculum was removed.   Attention was focused on abdomen. Supraumbilical 12 mm vertical incision made with scalpel after injecting Marcaine, fascia dissected, grasped with Kocher's and incised, posterior rectus sheath and peritoneum grasped,  incised, intraabdominal entry confirmed. Purse string stay stitch on 0-Vicryl taken on fascia and Hassan cannula introduced and Vicryl sutures secured on the Hassan cannula. Pneumoperitoneum was begun. Laparoscope was introduced and the peritoneal cavity was evaluated. There was no evidence of adhesions. Trendelenburg position given.  Port sited marked and injected with Ropivacaine. Two Robotic cannulas inserted on right side and one on the left side under vision. Also assist port placed on the left. Instruments docked.  Dr. Benjie Karvonen scrubbed out and went for surgical console. Dr Pamala Hurry remained at patient side the entire time.  Uterus was enlarged, globular, both ovaries were enlarged with PCOS appearance. Both tubes noted evidence of tubal ligation. One Filsche clip was still on left tube and the right clip landed on left anterior peritoneal fold over the bladder. Both the ureters evaluated, appeared normal, course tracked well.   Uterus was deviated to the patient's right. The left mesosalpinx was desiccated and cut, followed by broad ligament, left utero-ovarian ligament and then the left Round ligament which was desiccated and cut. Anterior bladder broad ligament was opened and incised. Posterior broad ligament incised up to the uterosacral ligament and left broad ligament in parametrial area was noted to have large tortuous vessels. Desiccation done to reach [facility] ring impression but uterine vessels were not cut as this side was very thick and it didn't seem like they were well desiccated. Board ligament bleeding controlled.  Uterus was deviated to the left and right IP was exposed, right mesosalpinx desiccated and cut, followed by right utero-ovarian ligament after releasing ovarian adhesions, followed by broad ligament and right Round ligament. Anterior broad ligament was incised to create bladder flap, bladder was pushed away by blunt and sharp dissection with excellent hemostasis. Koh ring impression at  cervicovaginal junction was not clear, so Dr Pamala Hurry placed two tenaculums on the cervic  vaginally to put cervix on traction and allow Koh ring impression to be seen well. Right Uterine vessels skeletonized. Right uterine vessels were desiccated and cut. Uterus was deviated to the right and the left uterine vessels were desiccated and cut. Vaginal occluder was inflated. Colpotomy was begun starting from midline anteriorly coming to the left and right and then circumferentially staying above the uterosacral ligaments posteriorly. Uterus, cervix, both tubes and ovaries were pulled out of the vagina and vaginal occluder placed back to keep the pneumoperitoneum.  Vaginal cut edges were evaluated for hemostasis which was excellent. Irrigation was performed pedicles appeared dry. Robotic instruments switched for needle driver and long tip tissue forceps. 0 VeeLock used for suturing vaginal cuff. Hemostasis as excellent after cuff closure.  Irrigation was performed, all pedicles appeared to be hemostatic. Robotic instruments were removed. Robot was de-docked. Patient was made supine. Lap'scope was reintroduced, hemostasis was excellent. Liver surface appeared normal. 50 cc dilute Ropivacaine instilled in the peritoneal cavity. All cannulas removed. Laparoscope and central port removed under vision. The stay sutures at the fascia tied together with excellent fascial closure. Skin approximated with subcuticular stitches on 4-0 Vicryl. Dermabond was applied. No vaginal bleeding noted on vaginal exam at the end. All instruments/lap/sponges counts were correct x2.  No complications. Patient tolerated procedure well and was reversed from anesthesia and brought to the PACU stable condition. Foley catheter to be removed in PACU.  Dr Benjie Karvonen was the surgeon for entire case.

## 2016-11-04 NOTE — H&P (Signed)
Jennifer Bean is an 41 y.o. female with pelvic pain, dysmenorrhea and dyspareunia, here for hysterectomy. She declined conservative options after counseling.  Know fibroids, no menorrhagia but dysmenorrhea is getting worse.  Long standing irreg menses, periods of amenorrhea from PCOS, has not taken progestins consistently, office endometrial biopsy was clear (no hyperplasia or cancer) G4P4, TL. Also lap chole and umbilical hernia repair (childhood)  Patient's last menstrual period was 10/26/2016 (exact date).    Past Medical History:  Diagnosis Date  . Anxiety   . Diabetes mellitus without complication ([place])   . High cholesterol   . Hypertension   . Sleep apnea    questionable had sleep study performed twice unsuccessful, needs to reschedule  . TIA (transient ischemic attack) 2014   no residual  . Vitamin D deficiency     Past Surgical History:  Procedure Laterality Date  . CHOLECYSTECTOMY    . TUBAL LIGATION    Umbilical hernia repair at age 59.  History reviewed. No pertinent family history.  Social History:  reports that she has never smoked. She has never used smokeless tobacco. She reports that she drinks alcohol. She reports that she does not use drugs.  Allergies:  Allergies  Allergen Reactions  . Latex Other (See Comments)    Irritation and swelling Family history     Prescriptions Prior to Admission  Medication Sig Dispense Refill Last Dose  . amLODipine (NORVASC) 5 MG tablet Take 5 mg by mouth daily.  0 11/04/2016 at 0640  . atorvastatin (LIPITOR) 20 MG tablet Take 20 mg by mouth daily.   11/04/2016 at 0640  . cholecalciferol (VITAMIN D) 1000 units tablet Take 1,000 Units by mouth daily.   11/04/2016 at 0640  . clonazePAM (KLONOPIN) 1 MG tablet Take 1 mg by mouth daily as needed for anxiety. anxiety  1 Past Week at Unknown time  . gabapentin (NEURONTIN) 300 MG capsule Take 300 mg by mouth 3 (three) times daily.   11/04/2016 at 0640  . ibuprofen (ADVIL,MOTRIN) 800 MG  tablet Take 400-800 mg by mouth 2 (two) times daily as needed for moderate pain (takes 0.5-1 depending on pain level).    Past Month at Unknown time  . Lactobacillus (PROBIOTIC ACIDOPHILUS PO) Take by mouth.   Past Week at Unknown time  . metFORMIN (GLUCOPHAGE) 500 MG tablet Take 500 mg by mouth 2 (two) times daily with a meal.   11/04/2016 at 0640  . PROAIR HFA 108 (90 Base) MCG/ACT inhaler INHALE 2 PUFFS 4 TIMES DAILY AS NEEDED FOR SHORTNESS OF BREATH  0 More than a month at Unknown time    ROS pelvic pain  Blood pressure (!) 129/91, pulse 96, temperature 98.3 F (36.8 C), temperature source Oral, resp. rate (!) 22, last menstrual period 10/26/2016, SpO2 100 %. Physical Exam   A&O x 3, no acute distress. Pleasant HEENT neg, no thyromegaly Lungs CTA bilat CV RRR, S1S2 normal Abdo soft, non tender, non acute Extr no edema/ tenderness Pelvic 10 wk uterus, no adnexal masses   Results for orders placed or performed during the hospital encounter of 11/04/16 (from the past 24 hour(s))  Pregnancy, urine     Status: None   Collection Time: 11/04/16 11:30 AM  Result Value Ref Range   Preg Test, Ur NEGATIVE NEGATIVE  Glucose, capillary     Status: Abnormal   Collection Time: 11/04/16 11:43 AM  Result Value Ref Range   Glucose-Capillary 118 (H) 65 - 99 mg/dL    No  results found.  Assessment/Plan: 41 yo female with dysmenorrhea and irreg menses, here for davinci assisted laparoscopic hysterectomy and bilateral salpingectomy  Risks/complications of surgery reviewed incl infection, bleeding, damage to internal organs including bladder, bowels, ureters, blood vessels, other risks from anesthesia, VTE and delayed complications of any surgery, complications in future surgery reviewed.   Tikita Mabee R 11/04/2016, 12:33 PM

## 2016-11-04 NOTE — Anesthesia Postprocedure Evaluation (Signed)
Anesthesia Post Note  Patient: Jennifer Bean  Procedure(s) Performed: Procedure(s) (LRB): ROBOTIC ASSISTED TOTAL HYSTERECTOMY WITH SALPINGECTOMY (Bilateral)  Patient location during evaluation: PACU Anesthesia Type: General Level of consciousness: awake and alert Pain management: pain level controlled Vital Signs Assessment: post-procedure vital signs reviewed and stable Respiratory status: spontaneous breathing, nonlabored ventilation, respiratory function stable and patient connected to nasal cannula oxygen Cardiovascular status: blood pressure returned to baseline and stable Postop Assessment: no signs of nausea or vomiting Anesthetic complications: no        Last Vitals:  Vitals:   11/04/16 1715 11/04/16 1730  BP: 127/79 124/72  Pulse: (!) 101 99  Resp: (!) 28 (!) 25  Temp:      Last Pain:  Vitals:   11/04/16 1136  TempSrc: Oral   Pain Goal: Patients Stated Pain Goal: 0 (11/04/16 1730)               Tiajuana Amass

## 2016-11-05 ENCOUNTER — Encounter (HOSPITAL_COMMUNITY): Payer: Self-pay | Admitting: Obstetrics & Gynecology

## 2016-11-05 LAB — GLUCOSE, CAPILLARY
GLUCOSE-CAPILLARY: 112 mg/dL — AB (ref 65–99)
Glucose-Capillary: 174 mg/dL — ABNORMAL HIGH (ref 65–99)

## 2016-11-05 LAB — BASIC METABOLIC PANEL
Anion gap: 5 (ref 5–15)
BUN: 7 mg/dL (ref 6–20)
CHLORIDE: 103 mmol/L (ref 101–111)
CO2: 28 mmol/L (ref 22–32)
CREATININE: 0.89 mg/dL (ref 0.44–1.00)
Calcium: 8.7 mg/dL — ABNORMAL LOW (ref 8.9–10.3)
GFR calc Af Amer: >60 mL/min (ref >60)
Glucose, Bld: 137 mg/dL — ABNORMAL HIGH (ref 65–99)
Potassium: 4.2 mmol/L (ref 3.5–5.1)
SODIUM: 136 mmol/L (ref 135–145)

## 2016-11-05 LAB — CBC
HCT: 34.7 % — ABNORMAL LOW (ref 36.0–46.0)
Hemoglobin: 11.4 g/dL — ABNORMAL LOW (ref 12.0–15.0)
MCH: 25.7 pg — ABNORMAL LOW (ref 26.0–34.0)
MCHC: 32.9 g/dL (ref 30.0–36.0)
MCV: 78.2 fL (ref 78.0–100.0)
PLATELETS: 366 10*3/uL (ref 150–400)
RBC: 4.44 MIL/uL (ref 3.87–5.11)
RDW: 14.8 % (ref 11.5–15.5)
WBC: 11.5 10*3/uL — ABNORMAL HIGH (ref 4.0–10.5)

## 2016-11-05 MED ORDER — OXYCODONE-ACETAMINOPHEN 5-325 MG PO TABS: 1.0000 | ORAL_TABLET | ORAL | 0 refills | Status: DC | PRN

## 2016-11-05 NOTE — Anesthesia Postprocedure Evaluation (Signed)
Anesthesia Post Note  Patient: Jennifer Bean  Procedure(s) Performed: Procedure(s) (LRB): ROBOTIC ASSISTED TOTAL HYSTERECTOMY WITH SALPINGECTOMY (Bilateral)  Patient location during evaluation: [facility] Anesthesia Type: General Level of consciousness: awake and alert Pain management: pain level controlled Vital Signs Assessment: post-procedure vital signs reviewed and stable Respiratory status: spontaneous breathing, nonlabored ventilation and respiratory function stable Cardiovascular status: blood pressure returned to baseline and stable Postop Assessment: no signs of nausea or vomiting Anesthetic complications: no        Last Vitals:  Vitals:   11/05/16 0033 11/05/16 0400  BP: 131/69 126/60  Pulse: (!) 109 99  Resp:    Temp: 36.9 C 37 C    Last Pain:  Vitals:   11/05/16 0410  TempSrc:   PainSc: 3    Pain Goal: Patients Stated Pain Goal: 3 (11/05/16 0258)               Riki Sheer

## 2016-11-05 NOTE — Progress Notes (Signed)
1 Day Post-Op Procedure(s) (LRB): ROBOTIC ASSISTED TOTAL HYSTERECTOMY WITH SALPINGECTOMY (Bilateral)  Subjective: Patient reports incisional pain, tolerating PO, + flatus and no problems voiding.  Nausea resolved. Ate meals this AM. Slight spotting when wiped after a void. Gas pain.   Objective: BP (!) 118/52 (BP Location: Left Arm)   Pulse 93   Temp 98.1 F (36.7 C) (Oral)   Resp 20   LMP 10/26/2016 (Exact Date)   SpO2 99%   General: alert, cooperative and appears stated age Resp: clear to auscultation bilaterally Cardio: regular rate and rhythm, S1, S2 normal, no murmur, click, rub or gallop GI: soft, non-tender; bowel sounds normal; no masses,  no organomegaly Extremities: extremities normal, atraumatic, no cyanosis or edema Vaginal Bleeding: none  Assessment: s/p Procedure(s): ROBOTIC ASSISTED TOTAL HYSTERECTOMY WITH SALPINGECTOMY (Bilateral): stable, progressing well, tolerating diet and voiding well.  Plan: Discharge home  Surgical findings and post-op care reviewed- pelvic rest, lifting restrictions, stay mobile to prevent VTE reviewed. Pain meds reviewed. Use stool softeners until diet better.   LOS: 1 day   Kalliopi Coupland R 11/05/2016, 12:02 PM

## 2016-11-05 NOTE — Progress Notes (Signed)
Pt ambulated out teaching complete  

## 2016-11-05 NOTE — Addendum Note (Signed)
Addendum  created 11/05/16 0745 by Riki Sheer, CRNA   Sign clinical note

## 2016-11-05 NOTE — Discharge Summary (Signed)
Physician Discharge Summary  Patient ID: Jennifer Bean MRN: [id number] DOB/AGE: [birth year] 43 y.o.  Admit date: 11/04/2016 Discharge date: 11/05/2016  Admission Diagnoses: Dysmenorrhea, fibroid uterus, pelvic pain  Discharge Diagnoses:  Same. Status post daVinci robot assisted total laparoscopic hysterectomy and bilateral salpingectomy on 11/04/2016  Discharged Condition: good  Hospital Course: Uncomplicated surgery. Post-op normal course, one episode of post-op nausea/vomiting, improved overnight, ate breakfast and lunch. Ambulating, voiding. Pain noted, especially gas pain. Active bowel sounds, normal incisions, CV/ RRR normal. BP normal. Extr- no VTE on exam. No vaginal bleeding, pain medications working well.  CBC Latest Ref Rng & Units 11/05/2016 10/23/2016  WBC 4.0 - 10.5 K/uL 11.5(H) 8.2  Hemoglobin 12.0 - 15.0 g/dL 11.4(L) 13.1  Hematocrit 36.0 - 46.0 % 34.7(L) 39.4  Platelets 150 - 400 K/uL 366 359   BMP Latest Ref Rng & Units 11/05/2016 10/23/2016  Glucose 65 - 99 mg/dL 782(N) 562(Z)  BUN 6 - 20 mg/dL 7 13  Creatinine 3.08 - 1.00 mg/dL 6.57 8.46  Sodium 962 - 145 mmol/L 136 135  Potassium 3.5 - 5.1 mmol/L 4.2 4.0  Chloride 101 - 111 mmol/L 103 103  CO2 22 - 32 mmol/L 28 24  Calcium 8.9 - 10.3 mg/dL 9.5(M) 9.3    Disposition: 01-Home or Self Care  Discharge Instructions    Call MD for:    Complete by:  As directed    Vaginal bleeding like a period   Call MD for:  difficulty breathing, headache or visual disturbances    Complete by:  As directed    Call MD for:  hives    Complete by:  As directed    Call MD for:  persistant dizziness or light-headedness    Complete by:  As directed    Call MD for:  persistant nausea and vomiting    Complete by:  As directed    Call MD for:  redness, tenderness, or signs of infection (pain, swelling, redness, odor or green/yellow discharge around incision site)    Complete by:  As directed    Call MD for:  severe uncontrolled pain     Complete by:  As directed    Call MD for:  temperature >100.4    Complete by:  As directed    Diet - low sodium heart healthy    Complete by:  As directed    Driving Restrictions    Complete by:  As directed    2 weeks   Increase activity slowly    Complete by:  As directed    Lifting restrictions    Complete by:  As directed    25 lbs only for 6 weeks   Sexual Activity Restrictions    Complete by:  As directed    6 weeks until doctor exam normal     Allergies as of 11/05/2016      Reactions   Latex Other (See Comments)   Irritation and swelling Family history       Medication List    TAKE these medications   amLODipine 5 MG tablet Commonly known as:  NORVASC Take 5 mg by mouth daily.   atorvastatin 20 MG tablet Commonly known as:  LIPITOR Take 20 mg by mouth daily.   cholecalciferol 1000 units tablet Commonly known as:  VITAMIN D Take 1,000 Units by mouth daily.   clonazePAM 1 MG tablet Commonly known as:  KLONOPIN Take 1 mg by mouth daily as needed for anxiety. anxiety   gabapentin 300  MG capsule Commonly known as:  NEURONTIN Take 300 mg by mouth 3 (three) times daily.   ibuprofen 800 MG tablet Commonly known as:  ADVIL,MOTRIN Take 400-800 mg by mouth 2 (two) times daily as needed for moderate pain (takes 0.5-1 depending on pain level).   metFORMIN 500 MG tablet Commonly known as:  GLUCOPHAGE Take 500 mg by mouth 2 (two) times daily with a meal.   oxyCODONE-acetaminophen 5-325 MG tablet Commonly known as:  PERCOCET/ROXICET Take 1-2 tablets by mouth every 4 (four) hours as needed for severe pain (moderate to severe pain (when tolerating fluids)).   PROAIR HFA 108 (90 Base) MCG/ACT inhaler Generic drug:  albuterol INHALE 2 PUFFS 4 TIMES DAILY AS NEEDED FOR SHORTNESS OF BREATH   PROBIOTIC ACIDOPHILUS PO Take by mouth.      Follow-up Information    Erika Slaby R, MD. Schedule an appointment as soon as possible for a visit in 2 week(s).    Specialty:  Obstetrics and Gynecology Contact information: [address] [place] [address] [phone]           Signed: Robley Fries 11/05/2016, 12:42 PM

## 2016-11-05 NOTE — Discharge Instructions (Signed)

## 2016-11-07 ENCOUNTER — Inpatient Hospital Stay (HOSPITAL_COMMUNITY): Payer: Medicaid Other

## 2016-11-07 ENCOUNTER — Inpatient Hospital Stay (HOSPITAL_COMMUNITY)
Admission: AD | Admit: 2016-11-07 | Discharge: 2016-11-13 | DRG: 864 | Disposition: A | Discharge status: Home / Self Care | Payer: Medicaid Other | Source: Ambulatory Visit | Attending: Obstetrics & Gynecology | Admitting: Obstetrics & Gynecology

## 2016-11-07 ENCOUNTER — Encounter (HOSPITAL_COMMUNITY): Payer: Self-pay

## 2016-11-07 DIAGNOSIS — Z9049 Acquired absence of other specified parts of digestive tract: Secondary | ICD-10-CM

## 2016-11-07 DIAGNOSIS — F419 Anxiety disorder, unspecified: Secondary | ICD-10-CM | POA: Diagnosis present

## 2016-11-07 DIAGNOSIS — R112 Nausea with vomiting, unspecified: Secondary | ICD-10-CM

## 2016-11-07 DIAGNOSIS — I10 Essential (primary) hypertension: Secondary | ICD-10-CM | POA: Diagnosis present

## 2016-11-07 DIAGNOSIS — Z79899 Other long term (current) drug therapy: Secondary | ICD-10-CM

## 2016-11-07 DIAGNOSIS — R197 Diarrhea, unspecified: Secondary | ICD-10-CM | POA: Diagnosis not present

## 2016-11-07 DIAGNOSIS — E119 Type 2 diabetes mellitus without complications: Secondary | ICD-10-CM | POA: Diagnosis present

## 2016-11-07 DIAGNOSIS — Z6841 Body Mass Index (BMI) 40.0 and over, adult: Secondary | ICD-10-CM

## 2016-11-07 DIAGNOSIS — Z9079 Acquired absence of other genital organ(s): Secondary | ICD-10-CM | POA: Diagnosis not present

## 2016-11-07 DIAGNOSIS — Z9889 Other specified postprocedural states: Secondary | ICD-10-CM

## 2016-11-07 DIAGNOSIS — Z90710 Acquired absence of both cervix and uterus: Secondary | ICD-10-CM

## 2016-11-07 DIAGNOSIS — Z8673 Personal history of transient ischemic attack (TIA), and cerebral infarction without residual deficits: Secondary | ICD-10-CM

## 2016-11-07 DIAGNOSIS — R5082 Postprocedural fever: Principal | ICD-10-CM | POA: Diagnosis present

## 2016-11-07 DIAGNOSIS — Z7984 Long term (current) use of oral hypoglycemic drugs: Secondary | ICD-10-CM

## 2016-11-07 DIAGNOSIS — G8918 Other acute postprocedural pain: Secondary | ICD-10-CM | POA: Diagnosis present

## 2016-11-07 DIAGNOSIS — R509 Fever, unspecified: Secondary | ICD-10-CM

## 2016-11-07 DIAGNOSIS — G4730 Sleep apnea, unspecified: Secondary | ICD-10-CM | POA: Diagnosis present

## 2016-11-07 DIAGNOSIS — Z91040 Latex allergy status: Secondary | ICD-10-CM | POA: Diagnosis not present

## 2016-11-07 HISTORY — DX: Fever, unspecified: R50.9

## 2016-11-07 LAB — CBC WITH DIFFERENTIAL/PLATELET
Basophils Absolute: 0 10*3/uL (ref 0.0–0.1)
Basophils Relative: 0 %
Eosinophils Absolute: 0.1 10*3/uL (ref 0.0–0.7)
Eosinophils Relative: 1 %
HEMATOCRIT: 36.1 % (ref 36.0–46.0)
Hemoglobin: 11.6 g/dL — ABNORMAL LOW (ref 12.0–15.0)
LYMPHS PCT: 20 %
Lymphs Abs: 2.7 10*3/uL (ref 0.7–4.0)
MCH: 25.3 pg — ABNORMAL LOW (ref 26.0–34.0)
MCHC: 32.1 g/dL (ref 30.0–36.0)
MCV: 78.6 fL (ref 78.0–100.0)
MONO ABS: 1.2 10*3/uL — AB (ref 0.1–1.0)
MONOS PCT: 9 %
NEUTROS ABS: 9.4 10*3/uL — AB (ref 1.7–7.7)
Neutrophils Relative %: 70 %
Platelets: 339 10*3/uL (ref 150–400)
RBC: 4.59 MIL/uL (ref 3.87–5.11)
RDW: 15.1 % (ref 11.5–15.5)
WBC: 13.4 10*3/uL — ABNORMAL HIGH (ref 4.0–10.5)

## 2016-11-07 LAB — URINALYSIS, ROUTINE W REFLEX MICROSCOPIC
Bilirubin Urine: NEGATIVE
GLUCOSE, UA: NEGATIVE mg/dL
KETONES UR: 5 mg/dL — AB
Nitrite: NEGATIVE
PH: 6 (ref 5.0–8.0)
Protein, ur: NEGATIVE mg/dL
Specific Gravity, Urine: 1.017 (ref 1.005–1.030)

## 2016-11-07 LAB — LACTIC ACID, PLASMA: Lactic Acid, Venous: 0.7 mmol/L (ref 0.5–1.9)

## 2016-11-07 LAB — COMPREHENSIVE METABOLIC PANEL
ALK PHOS: 54 U/L (ref 38–126)
ALT: 25 U/L (ref 14–54)
AST: 18 U/L (ref 15–41)
Albumin: 3.7 g/dL (ref 3.5–5.0)
Anion gap: 7 (ref 5–15)
BILIRUBIN TOTAL: 0.7 mg/dL (ref 0.3–1.2)
BUN: 8 mg/dL (ref 6–20)
CALCIUM: 8.5 mg/dL — AB (ref 8.9–10.3)
CO2: 27 mmol/L (ref 22–32)
Chloride: 100 mmol/L — ABNORMAL LOW (ref 101–111)
Creatinine, Ser: 0.91 mg/dL (ref 0.44–1.00)
GFR calc Af Amer: >60 mL/min (ref >60)
GFR calc non Af Amer: >60 mL/min (ref >60)
GLUCOSE: 145 mg/dL — AB (ref 65–99)
Potassium: 3.5 mmol/L (ref 3.5–5.1)
Sodium: 134 mmol/L — ABNORMAL LOW (ref 135–145)
TOTAL PROTEIN: 6.9 g/dL (ref 6.5–8.1)

## 2016-11-07 MED ORDER — PIPERACILLIN-TAZOBACTAM 3.375 G IVPB
3.3750 g | Freq: Three times a day (TID) | INTRAVENOUS | Status: DC
  Administered 2016-11-07 – 2016-11-12 (×15): 3.375 g via INTRAVENOUS
  Filled 2016-11-07 (×17): qty 50

## 2016-11-07 MED ORDER — ATORVASTATIN CALCIUM 20 MG PO TABS
20.0000 mg | ORAL_TABLET | Freq: Every day | ORAL | Status: DC
  Administered 2016-11-07 – 2016-11-13 (×7): 20 mg via ORAL
  Filled 2016-11-07 (×8): qty 1

## 2016-11-07 MED ORDER — METFORMIN HCL 500 MG PO TABS
500.0000 mg | ORAL_TABLET | Freq: Two times a day (BID) | ORAL | Status: DC
  Administered 2016-11-07 – 2016-11-12 (×11): 500 mg via ORAL
  Filled 2016-11-07 (×15): qty 1

## 2016-11-07 MED ORDER — AMLODIPINE BESYLATE 5 MG PO TABS
5.0000 mg | ORAL_TABLET | Freq: Every day | ORAL | Status: DC
  Administered 2016-11-07 – 2016-11-13 (×7): 5 mg via ORAL
  Filled 2016-11-07 (×7): qty 1

## 2016-11-07 MED ORDER — LACTATED RINGERS IV BOLUS (SEPSIS)
1000.0000 mL | Freq: Once | INTRAVENOUS | Status: AC
  Administered 2016-11-07: 1000 mL via INTRAVENOUS

## 2016-11-07 MED ORDER — ACETAMINOPHEN 325 MG PO TABS
650.0000 mg | ORAL_TABLET | ORAL | Status: DC | PRN
  Administered 2016-11-07 – 2016-11-12 (×3): 650 mg via ORAL
  Filled 2016-11-07 (×4): qty 2

## 2016-11-07 MED ORDER — ONDANSETRON HCL 4 MG/2ML IJ SOLN
4.0000 mg | Freq: Once | INTRAMUSCULAR | Status: AC
  Administered 2016-11-07: 4 mg via INTRAVENOUS
  Filled 2016-11-07: qty 2

## 2016-11-07 MED ORDER — MAGNESIUM CITRATE PO SOLN
0.2500 | Freq: Once | ORAL | Status: DC
  Filled 2016-11-07: qty 296

## 2016-11-07 MED ORDER — LACTATED RINGERS IV SOLN
INTRAVENOUS | Status: DC
  Administered 2016-11-07 (×2): via INTRAVENOUS

## 2016-11-07 MED ORDER — HYDROMORPHONE HCL 1 MG/ML IJ SOLN
2.0000 mg | INTRAMUSCULAR | Status: DC | PRN
  Administered 2016-11-07: 2 mg via INTRAVENOUS
  Filled 2016-11-07: qty 2

## 2016-11-07 MED ORDER — IOPAMIDOL (ISOVUE-300) INJECTION 61%
100.0000 mL | Freq: Once | INTRAVENOUS | Status: AC | PRN
  Administered 2016-11-07: 100 mL via INTRAVENOUS

## 2016-11-07 MED ORDER — PIPERACILLIN-TAZOBACTAM 3.375 G IVPB 30 MIN
3.3750 g | Freq: Once | INTRAVENOUS | Status: AC
  Administered 2016-11-07: 3.375 g via INTRAVENOUS
  Filled 2016-11-07: qty 50

## 2016-11-07 MED ORDER — OXYCODONE-ACETAMINOPHEN 5-325 MG PO TABS
1.0000 | ORAL_TABLET | ORAL | Status: DC | PRN
  Administered 2016-11-07 – 2016-11-10 (×8): 2 via ORAL
  Administered 2016-11-11: 1 via ORAL
  Administered 2016-11-11 (×2): 2 via ORAL
  Filled 2016-11-07: qty 1
  Filled 2016-11-07 (×10): qty 2

## 2016-11-07 MED ORDER — HYDROMORPHONE HCL 1 MG/ML IJ SOLN
2.0000 mg | Freq: Once | INTRAMUSCULAR | Status: AC
  Administered 2016-11-07: 2 mg via INTRAVENOUS
  Filled 2016-11-07 (×2): qty 2

## 2016-11-07 MED ORDER — ONDANSETRON HCL 4 MG/2ML IJ SOLN
4.0000 mg | Freq: Four times a day (QID) | INTRAMUSCULAR | Status: DC | PRN
  Administered 2016-11-07 – 2016-11-12 (×6): 4 mg via INTRAVENOUS
  Filled 2016-11-07 (×6): qty 2

## 2016-11-07 MED ORDER — IBUPROFEN 600 MG PO TABS: 600.0000 mg | ORAL_TABLET | Freq: Four times a day (QID) | ORAL | Status: DC | PRN

## 2016-11-07 MED ORDER — IOPAMIDOL (ISOVUE-300) INJECTION 61%
30.0000 mL | INTRAVENOUS | Status: AC
  Administered 2016-11-07 (×2): 30 mL via ORAL

## 2016-11-07 NOTE — Progress Notes (Signed)
Patient ID: Jennifer Bean, female   DOB: 1976-03-15, 41 y.o.   MRN: [id number] Pt was discussed with MAU provider. Provider noted pain management issues only and isolated fever. Discussed sepsis protocol and awaiting lab results prior to triage. Discussed possible etiologies of fever in acute post operative period. Provider noted completely nl PE and only slight leukocytosis. Due to my lack of familiarity with the patient and the surgical case, I asked her to attempt to reach Dr. Benjie Karvonen and to call me back if she was unable to reach her for discussion of management.

## 2016-11-07 NOTE — MAU Provider Note (Signed)
Chief Complaint: Post-op Problem; Fever; and Hypertension   First Provider Initiated Contact with Patient 11/07/16 0132      SUBJECTIVE HPI: Jennifer Bean is a 41 y.o. female 3 days status-post daVinci robot assisted total laparoscopic hysterectomy and bilateral salpingectomy on 11/04/2016 performed by Dr. Benjie Karvonen who presents to Maternity Admissions reporting fever of 103, worsening abdominal pain, decreased appetite and feeling generally un-well since the evening of 11/06/2016. Recovery had been going well up until then. She reports she had had a normal appetite and her pain at been well-controlled with Percocet and ibuprofen, but started feeling very sick yesterday evening. She took 2 Percocet and ibuprofen at 8 PM and it brought her temperature down to 100.   Patient began vomiting when she arrived in maternity admissions.  Location: Low abdomen Quality: Sore Severity: 10/10 on pain scale Duration: Since 6 PM on 11/06/2016 Context: Postop hysterectomy Timing: Constant Modifying factors: Pain previously controlled with ibuprofen and Percocet, but no longer getting any relief with same medications.  Associated signs and symptoms: Positive for fever, chills, nausea, vomiting, malaise. Negative for vaginal bleeding, wound complications, diarrhea, sick contacts, cough, leg swelling, urinary complaints or flank pain. Patient has been passing flatus but has not had a bowel movement since surgery.  Past Medical History:  Diagnosis Date  . Anxiety   . Diabetes mellitus without complication ([place])   . Dysmenorrhea 11/04/2016  . High cholesterol   . Hypertension   . Sleep apnea    questionable had sleep study performed twice unsuccessful, needs to reschedule  . TIA (transient ischemic attack) 2014   no residual  . Vitamin D deficiency    OB History  No data available   Past Surgical History:  Procedure Laterality Date  . CHOLECYSTECTOMY    . ROBOTIC ASSISTED TOTAL HYSTERECTOMY WITH  SALPINGECTOMY Bilateral 11/04/2016   Procedure: ROBOTIC ASSISTED TOTAL HYSTERECTOMY WITH SALPINGECTOMY;  Surgeon: Azucena Fallen, MD;  Location: [facility];  Service: Gynecology;  Laterality: Bilateral;  . TUBAL LIGATION     Social History   Social History  . Marital status: Single    Spouse name: N/A  . Number of children: N/A  . Years of education: N/A   Occupational History  . Not on file.   Social History Main Topics  . Smoking status: Never Smoker  . Smokeless tobacco: Never Used  . Alcohol use Yes     Comment: social  . Drug use: No  . Sexual activity: Yes    Birth control/ protection: Surgical   Other Topics Concern  . Not on file   Social History Narrative  . No narrative on file   History reviewed. No pertinent family history. No current facility-administered medications on file prior to encounter.    Current Outpatient Prescriptions on File Prior to Encounter  Medication Sig Dispense Refill  . amLODipine (NORVASC) 5 MG tablet Take 5 mg by mouth daily.  0  . atorvastatin (LIPITOR) 20 MG tablet Take 20 mg by mouth daily.    . cholecalciferol (VITAMIN D) 1000 units tablet Take 1,000 Units by mouth daily.    . clonazePAM (KLONOPIN) 1 MG tablet Take 1 mg by mouth daily as needed for anxiety. anxiety  1  . gabapentin (NEURONTIN) 300 MG capsule Take 300 mg by mouth 3 (three) times daily.    Marland Kitchen ibuprofen (ADVIL,MOTRIN) 800 MG tablet Take 400-800 mg by mouth 2 (two) times daily as needed for moderate pain (takes 0.5-1 depending on pain level).     Marland Kitchen  metFORMIN (GLUCOPHAGE) 500 MG tablet Take 500 mg by mouth 2 (two) times daily with a meal.    . oxyCODONE-acetaminophen (PERCOCET/ROXICET) 5-325 MG tablet Take 1-2 tablets by mouth every 4 (four) hours as needed for severe pain (moderate to severe pain (when tolerating fluids)). 30 tablet 0  . Lactobacillus (PROBIOTIC ACIDOPHILUS PO) Take by mouth.    Marland Kitchen PROAIR HFA 108 (90 Base) MCG/ACT inhaler INHALE 2 PUFFS 4 TIMES DAILY AS NEEDED  FOR SHORTNESS OF BREATH  0   Allergies  Allergen Reactions  . Latex Other (See Comments)    Irritation and swelling Family history     I have reviewed patient's Past Medical Hx, Surgical Hx, Family Hx, Social Hx, medications and allergies.   Review of Systems  Constitutional: Positive for appetite change, chills, fatigue and fever.  HENT: Negative for congestion and sore throat.   Respiratory: Negative for cough and shortness of breath.   Cardiovascular: Negative for leg swelling.  Gastrointestinal: Positive for abdominal pain, constipation, nausea and vomiting. Negative for diarrhea.  Genitourinary: Negative for dysuria, flank pain, frequency, hematuria, pelvic pain, vaginal bleeding and vaginal discharge.  Musculoskeletal: Negative for myalgias.    OBJECTIVE Patient Vitals for the past 24 hrs:  BP Temp Temp src Pulse Resp SpO2  11/07/16 0335 - (!) 100.4 F (38 C) Oral (!) 102 20 95 %  11/07/16 0316 - - - (!) 105 - 96 %  11/07/16 0312 133/76 - - 96 20 96 %  11/07/16 0300 - - - - - 96 %  11/07/16 0240 - - - - 20 97 %  11/07/16 0217 117/65 (!) 101.1 F (38.4 C) Oral 99 20 100 %  11/07/16 0201 - 99.9 F (37.7 C) Oral - - 98 %   Constitutional: Well-developed, well-nourished, Obese female in Mild distress.  Cardiovascular: Mild tachycardia Respiratory: normal rate and effort., CTAB  GI: Abd mildly distended, moderate tenderness to palpation throughout entire abdomen, severe tenderness above and below umbilicus, port sites without erythema, swelling, drainage or bleeding. Well approximated. Pos BS x 4 MS: Extremities nontender, no edema Neurologic: Alert and oriented x 4.  GU: Neg CVAT.  SPECULUM EXAM: Deferred   LAB RESULTS Results for orders placed or performed during the hospital encounter of 11/07/16 (from the past 24 hour(s))  Urinalysis, Routine w reflex microscopic     Status: Abnormal   Collection Time: 11/07/16 12:43 AM  Result Value Ref Range   Color, Urine  YELLOW YELLOW   APPearance HAZY (A) CLEAR   Specific Gravity, Urine 1.017 1.005 - 1.030   pH 6.0 5.0 - 8.0   Glucose, UA NEGATIVE NEGATIVE mg/dL   Hgb urine dipstick MODERATE (A) NEGATIVE   Bilirubin Urine NEGATIVE NEGATIVE   Ketones, ur 5 (A) NEGATIVE mg/dL   Protein, ur NEGATIVE NEGATIVE mg/dL   Nitrite NEGATIVE NEGATIVE   Leukocytes, UA TRACE (A) NEGATIVE   RBC / HPF 0-5 0 - 5 RBC/hpf   WBC, UA 0-5 0 - 5 WBC/hpf   Bacteria, UA RARE (A) NONE SEEN   Squamous Epithelial / LPF 6-30 (A) NONE SEEN   Mucous PRESENT   CBC with Differential/Platelet     Status: Abnormal   Collection Time: 11/07/16  1:55 AM  Result Value Ref Range   WBC 13.4 (H) 4.0 - 10.5 K/uL   RBC 4.59 3.87 - 5.11 MIL/uL   Hemoglobin 11.6 (L) 12.0 - 15.0 g/dL   HCT 36.1 36.0 - 46.0 %   MCV 78.6 78.0 - 100.0  fL   MCH 25.3 (L) 26.0 - 34.0 pg   MCHC 32.1 30.0 - 36.0 g/dL   RDW 15.1 11.5 - 15.5 %   Platelets 339 150 - 400 K/uL   Neutrophils Relative % 70 %   Neutro Abs 9.4 (H) 1.7 - 7.7 K/uL   Lymphocytes Relative 20 %   Lymphs Abs 2.7 0.7 - 4.0 K/uL   Monocytes Relative 9 %   Monocytes Absolute 1.2 (H) 0.1 - 1.0 K/uL   Eosinophils Relative 1 %   Eosinophils Absolute 0.1 0.0 - 0.7 K/uL   Basophils Relative 0 %   Basophils Absolute 0.0 0.0 - 0.1 K/uL  Comprehensive metabolic panel     Status: Abnormal   Collection Time: 11/07/16  1:55 AM  Result Value Ref Range   Sodium 134 (L) 135 - 145 mmol/L   Potassium 3.5 3.5 - 5.1 mmol/L   Chloride 100 (L) 101 - 111 mmol/L   CO2 27 22 - 32 mmol/L   Glucose, Bld 145 (H) 65 - 99 mg/dL   BUN 8 6 - 20 mg/dL   Creatinine, Ser 0.91 0.44 - 1.00 mg/dL   Calcium 8.5 (L) 8.9 - 10.3 mg/dL   Total Protein 6.9 6.5 - 8.1 g/dL   Albumin 3.7 3.5 - 5.0 g/dL   AST 18 15 - 41 U/L   ALT 25 14 - 54 U/L   Alkaline Phosphatase 54 38 - 126 U/L   Total Bilirubin 0.7 0.3 - 1.2 mg/dL   GFR calc non Af Amer >60 >60 mL/min   GFR calc Af Amer >60 >60 mL/min   Anion gap 7 5 - 15  Lactic acid,  plasma     Status: None   Collection Time: 11/07/16  3:06 AM  Result Value Ref Range   Lactic Acid, Venous 0.7 0.5 - 1.9 mmol/L    IMAGING No results found.  MAU COURSE Patient with high-normal temperature upon arrival to MAU. CBC with differential, CMP, UA ordered. Patient then spiked fever 101.1. Sepsis protocol activated. Dr. Ronita Hipps notified of patient report of fever 103 at home and fever of 101.1 while in MAU. Also notified of significant increase in pain that had previously been well-controlled with Percocet and ibuprofen and notified of new-onset nausea and vomiting. MD stated that this was likely a pain management issue and did not respond w/ management that CNM thought was appropriate based on her in-person assessment of pt, especially in setting of post-op fever. CNM asked about calling Dr. Benjie Karvonen since she was the pt's surgeon. Dr. Ronita Hipps agreed.   Dr. Benjie Karvonen called, but did not answer initially. Dr. Elonda Husky ([facility] attending) called, but that Dr. Benjie Karvonen called back. Discussed Hx, labs, exam. New orders: Admit to third floor, CT. Agrees w/ initiation of sepsis protocol and including Zosyn. Will come see pt later this morning.     Orders Placed This Encounter  Procedures  . Culture, blood (routine x 2)  . Urine culture  . CT ABDOMEN PELVIS W CONTRAST  . Urinalysis, Routine w reflex microscopic  . CBC with Differential/Platelet  . Comprehensive metabolic panel  . Lactic acid, plasma  . Diet NPO time specified  . Insert peripheral IV   Meds ordered this encounter  Medications  . docusate sodium (COLACE) 100 MG capsule    Sig: Take 100 mg by mouth 2 (two) times daily.  . ondansetron (ZOFRAN) injection 4 mg  . lactated ringers bolus 1,000 mL  . HYDROmorphone (DILAUDID) injection 2 mg  .  piperacillin-tazobactam (ZOSYN) IVPB 3.375 g    Order Specific Question:   Antibiotic Indication:    Answer:   Sepsis  . iopamidol (ISOVUE-300) 61 % injection 30 mL  . ondansetron (ZOFRAN) injection 4  mg   MDM - Post-op fever, worsening pain and leukocytosis concerning for surgical site infection. Lactic Acid Nml which makes sepsis unlikely. Requires inpatient management. Will obtain CT and continue Zosyn.   ASSESSMENT 1. Postoperative fever   2. Postoperative nausea and vomiting   3. Acute post-operative pain     PLAN Admit to third floor per consult with Dr. Benjie Karvonen. Continue Zosyn. Dilaudid PRN pain Zofran PRN nausea and vomiting Tylenol PRN fever Dr. Benjie Karvonen assuming care of Pt.  [place], [place] 11/07/2016  4:27 AM

## 2016-11-07 NOTE — Progress Notes (Signed)
Patient ID: Jennifer Bean, female   DOB: 1975-10-06, 41 y.o.   MRN: [id number] Post-op fever, post-op day 3 Unclear cause of fever -  Exam normal except temp at 101 at 8 am today, mild tachycardia persists and c/o abdominal discomfort and some sweats.  CBC, BMP on, urine and blood cultures sent, CT scan normal (may be too early to see anything since only post-op day 3)  Tolerated small meal but no appetite, passing gas but notes gas pain and surgical site pain. She has ambulated just int he room.   Will continue Zosyn, pain meds- switch to PO, mag-citrate for BM aid and reassess progress. Discharge planning after 36 hrs no fever.

## 2016-11-07 NOTE — MAU Note (Signed)
Pt post op hysterectomy on 11/04/2016. States tonight she "just didn't feel well." States she took her temp at home and it was 103 and then she retook her temp after taking ibuprofen and percocet and her temp was down to 100. Pt states she has not had a BM but has been having gas. Is having abdominal pain and has been well controlled with pain medication, but tonight it got much worse. States she also has an increase in her BP, was 138/88 and pulse was 112.

## 2016-11-07 NOTE — H&P (Signed)
Post-Op Fever, s/p Lap'scopic hysterectomy on 11/04/16. HPI: Jennifer Bean is a 41 y.o. female 3 days status-post daVinci robot assisted totallaparoscopic hysterectomy and bilateral salpingectomy on 11/04/2016 performed by Dr. Benjie Karvonen who presents to Maternity Admissions reporting fever of 103, worsening abdominal pain, decreased appetite and feeling generally un-well since the evening of 11/06/2016. Recovery had been going well up until then. She reports she had had a normal appetite and her pain at been well-controlled with Percocet and ibuprofen, but started feeling very sick yesterday evening. She took 2 Percocet and ibuprofen at 8 PM and it brought her temperature down to 100.   Patient began vomiting when she arrived in maternity admissions.  Location: Low abdomen Quality: Sore Severity: 10/10 on pain scale Duration: Since 6 PM on 11/06/2016 Context: Postop hysterectomy Timing: Constant Modifying factors: Pain previously controlled with ibuprofen and Percocet, but no longer getting any relief with same medications.  Associated signs and symptoms: Positive for fever, chills, nausea, vomiting, malaise. Negative for vaginal bleeding, wound complications, diarrhea, sick contacts, cough, leg swelling, urinary complaints or flank pain. Patient has been passing flatus but has not had a bowel movement since surgery.  Past Medical History:  Diagnosis Date  . Anxiety   . Diabetes mellitus without complication ([place])   . Dysmenorrhea 11/04/2016  . High cholesterol   . Hypertension   . Sleep apnea    questionable had sleep study performed twice unsuccessful, needs to reschedule  . TIA (transient ischemic attack) 2014   no residual  . Vitamin D deficiency   Objective:   Temp:  [99.2 F (37.3 C)-101.8 F (38.8 C)] 99.2 F (37.3 C) (04/12 1200) Pulse Rate:  [96-118] 115 (04/12 1200) Resp:  [20] 20 (04/12 1200) BP: (117-135)/(65-76) 135/72 (04/12 1200) SpO2:  [94 %-100 %] 94 % (04/12  1200)  Physical exam:  A&O x 3, no acute distress. Pleasant HEENT neg, no thyromegaly Lungs CTA bilat CV RRR, S1S2 normal, slight tachycardia noted Abdo non acute, appears distended (body habitus), soft, mildly tender, active Bowel sounds Extr no edema/ tenderness Pelvic no bleeding or abnormal discharge reported.   Labs- CBC, LDH, BMP, UA reviewed. Urine culture and blood culture sent per protocol Abdo/pelvic CT - negative  A/P: Post-operative fever, POD#3. No specific cause noted on exam and CT today Watch progress and fever Continue Zosyn Ok for regular diet if no further vomiting

## 2016-11-08 ENCOUNTER — Inpatient Hospital Stay (HOSPITAL_COMMUNITY): Payer: Medicaid Other

## 2016-11-08 LAB — CBC WITH DIFFERENTIAL/PLATELET
Basophils Absolute: 0 10*3/uL (ref 0.0–0.1)
Basophils Relative: 0 %
EOS ABS: 0.2 10*3/uL (ref 0.0–0.7)
Eosinophils Relative: 1 %
HEMATOCRIT: 33.3 % — AB (ref 36.0–46.0)
HEMOGLOBIN: 10.9 g/dL — AB (ref 12.0–15.0)
LYMPHS ABS: 3.2 10*3/uL (ref 0.7–4.0)
Lymphocytes Relative: 19 %
MCH: 25.6 pg — AB (ref 26.0–34.0)
MCHC: 32.7 g/dL (ref 30.0–36.0)
MCV: 78.4 fL (ref 78.0–100.0)
MONOS PCT: 5 %
Monocytes Absolute: 0.8 10*3/uL (ref 0.1–1.0)
NEUTROS PCT: 75 %
Neutro Abs: 12.3 10*3/uL — ABNORMAL HIGH (ref 1.7–7.7)
Platelets: 326 10*3/uL (ref 150–400)
RBC: 4.25 MIL/uL (ref 3.87–5.11)
RDW: 14.8 % (ref 11.5–15.5)
WBC: 16.5 10*3/uL — ABNORMAL HIGH (ref 4.0–10.5)

## 2016-11-08 LAB — URINE CULTURE: SPECIAL REQUESTS: NORMAL

## 2016-11-08 MED ORDER — ENOXAPARIN SODIUM 60 MG/0.6ML ~~LOC~~ SOLN
0.5000 mg/kg | SUBCUTANEOUS | Status: DC
  Administered 2016-11-08 – 2016-11-13 (×6): 55 mg via SUBCUTANEOUS
  Filled 2016-11-08 (×7): qty 0.6

## 2016-11-08 NOTE — Progress Notes (Signed)
HD# 2. Admitted for Fever on post -op 3rd day. S/p Robotic TLH/BS  Subjective: HA, fever, chills. Slight mucous like vomit, no bile or acid per pt. No runny nose or cold. Has loose BM last night, still no appetite, eating small meals No SOB/ CP/ LE pain or swelling/ UTI ss/ URI ss/ cough/ flank pain/ vaginal or rectal pain. No new medications since discharge.   Objective: Vital signs in last 24 hours: Temp:  [98.3 F (36.8 C)-103.1 F (39.5 C)] 98.9 F (37.2 C) (04/13 0845) Pulse Rate:  [90-125] 90 (04/13 0845) Resp:  [18-20] 20 (04/13 0845) BP: (87-135)/(44-73) 118/69 (04/13 0845) SpO2:  [94 %-100 %] 99 % (04/13 0845) Weight:  [252 lb 4 oz (114.4 kg)] 252 lb 4 oz (114.4 kg) (04/13 0604) Weight change:  Last BM Date: 11/07/16 ( large BM)  Intake/Output from previous day: 04/12 0701 - 04/13 0700 In: 360 [P.O.:360] Out: 1700 [Urine:1700] Intake/Output this shift: No intake/output data recorded.  Physical exam:  A&O x 3, overwhelmed and teary about hospital stay HEENT neg Lungs CTA bilat, no crepitus CV RRR, S1S2 normal but slight tachycardia Abdo soft, appears distended (pt says its nl for her), active bowel sounds in all 4, no rebound or guarding, incisions normal  Extr no edema/ tenderness, Homman's negative  Pelvic deferred, but no bleeding or discharge reported    Lab Results:  CBC Latest Ref Rng & Units 11/08/2016 11/07/2016 11/05/2016  WBC 4.0 - 10.5 K/uL 16.5(H) 13.4(H) 11.5(H)  Hemoglobin 12.0 - 15.0 g/dL 10.9(L) 11.6(L) 11.4(L)  Hematocrit 36.0 - 46.0 % 33.3(L) 36.1 34.7(L)  Platelets 150 - 400 K/uL 326 339 366   Urine and blood cultures pending  Assessment/Plan: Post-op day 4 and HD #2. Fever, worsening WBC count.  Unexplained etiology as exam and CT non focal. Likely some abdominal process as only symptoms pt has are poor appetite and nausea.. On Zosyn, continue now but if fever spike again, will consider switching, Urine and blood culture awaited.  Remains  inpatient.   Brent Noto R 11/08/2016, 9:47 AM Patient ID: Jennifer Bean, female   DOB: [birth year], 41 y.o.   MRN: [id number]

## 2016-11-09 LAB — CBC WITH DIFFERENTIAL/PLATELET
Basophils Absolute: 0 10*3/uL (ref 0.0–0.1)
Basophils Relative: 0 %
EOS ABS: 0.2 10*3/uL (ref 0.0–0.7)
EOS PCT: 2 %
HCT: 31.1 % — ABNORMAL LOW (ref 36.0–46.0)
Hemoglobin: 10 g/dL — ABNORMAL LOW (ref 12.0–15.0)
Lymphocytes Relative: 20 %
Lymphs Abs: 2.3 10*3/uL (ref 0.7–4.0)
MCH: 25.3 pg — ABNORMAL LOW (ref 26.0–34.0)
MCHC: 32.2 g/dL (ref 30.0–36.0)
MCV: 78.5 fL (ref 78.0–100.0)
MONO ABS: 0.7 10*3/uL (ref 0.1–1.0)
MONOS PCT: 6 %
NEUTROS ABS: 8.2 10*3/uL — AB (ref 1.7–7.7)
Neutrophils Relative %: 72 %
PLATELETS: 332 10*3/uL (ref 150–400)
RBC: 3.96 MIL/uL (ref 3.87–5.11)
RDW: 14.2 % (ref 11.5–15.5)
WBC: 11.4 10*3/uL — ABNORMAL HIGH (ref 4.0–10.5)

## 2016-11-09 LAB — COMPREHENSIVE METABOLIC PANEL
ALBUMIN: 3.2 g/dL — AB (ref 3.5–5.0)
ALT: 33 U/L (ref 14–54)
ANION GAP: 6 (ref 5–15)
AST: 20 U/L (ref 15–41)
Alkaline Phosphatase: 51 U/L (ref 38–126)
BILIRUBIN TOTAL: 0.5 mg/dL (ref 0.3–1.2)
BUN: 5 mg/dL — ABNORMAL LOW (ref 6–20)
CO2: 30 mmol/L (ref 22–32)
Calcium: 8.6 mg/dL — ABNORMAL LOW (ref 8.9–10.3)
Chloride: 101 mmol/L (ref 101–111)
Creatinine, Ser: 0.87 mg/dL (ref 0.44–1.00)
GFR calc Af Amer: >60 mL/min (ref >60)
GFR calc non Af Amer: >60 mL/min (ref >60)
GLUCOSE: 114 mg/dL — AB (ref 65–99)
POTASSIUM: 3.2 mmol/L — AB (ref 3.5–5.1)
Sodium: 137 mmol/L (ref 135–145)
Total Protein: 6.6 g/dL (ref 6.5–8.1)

## 2016-11-09 LAB — GLUCOSE, CAPILLARY: Glucose-Capillary: 122 mg/dL — ABNORMAL HIGH (ref 65–99)

## 2016-11-09 MED ORDER — PANTOPRAZOLE SODIUM 40 MG PO TBEC
40.0000 mg | DELAYED_RELEASE_TABLET | Freq: Every day | ORAL | Status: DC
  Administered 2016-11-09 – 2016-11-13 (×5): 40 mg via ORAL
  Filled 2016-11-09 (×5): qty 1

## 2016-11-09 NOTE — Progress Notes (Signed)
Hospital day #3, readmission for postoperative fever. Status post robotic-assisted total laparoscopic hysterectomy, bilateral salpingectomy on April 9   Subjective: Patient notes not feeling well but can't put her finger on it. Patient notes feeling anxious. Patient states when she talks or walks she has some anxious type feeling and notes her heart racing and her respirations increased. Patient denies chest pain or shortness of breath. Patient denies heartburn symptoms. Patient notes intermittent nausea since admission. She has not had any emesis and tolerates most but not all of her meals. Patient notes slightly more then normal flatus and had 2 bowel movements yesterday and one today. Patient denies constipation or diarrhea, no blood in her stool. Patient denies dysuria or hematuria. Patient states no vaginal bleeding. Patient has no pain in her legs or swelling of her legs. Patient with continued fevers. Last fever at 4:30 AM. Patient reports having a slight headache over the past several days but her headache worsened shortly before she has a fever. Patient has no significant abdominal pain.   Patient reports no cough, no sore throat no recent out of the country travel no known sick contacts. Patient has no history of cardiac disease.   Patient is a nonsmoker but does have diabetes hypertension and hypercholesterolemia. Patient has no known history of VTE and no family history of the VTE. Patient denies history of thyroid disorders.   PE: Vitals:   11/09/16 0905 11/09/16 1000 11/09/16 1220 11/09/16 1413  BP: 112/66  115/72   Pulse: 87  98   Resp: 18  18   Temp: 98 F (36.7 C) 99.5 F (37.5 C) 98.9 F (37.2 C) 100.1 F (37.8 C)  TempSrc:  Oral  Oral  SpO2: 100%  100%   Weight:      Height:       Gen.: No acute distress laying in bed Cardiovascular: Regular rate and rhythm, no murmur Pulmonary: Lungs clear with good air movement, no crackles, no wheeze, tachypnea Neck: No nodules no  thyromegaly Abdomen: No right upper quadrant pain, obese, slight tympany and soft distention but no rebound or guarding Incisions: Closed with Dermabond no bruising no erythema no incisional breakdown, no evidence of port site hernia GU: Deferred, pad dry Lower extremity: No edema, no warmth, no cords Back: No costovertebral angle tenderness  CBC Latest Ref Rng & Units 11/09/2016 11/08/2016 11/07/2016  WBC 4.0 - 10.5 K/uL 11.4(H) 16.5(H) 13.4(H)  Hemoglobin 12.0 - 15.0 g/dL 10.0(L) 10.9(L) 11.6(L)  Hematocrit 36.0 - 46.0 % 31.1(L) 33.3(L) 36.1  Platelets 150 - 400 K/uL 332 326 339    Urine culture: Multiple organisms nonfocal Blood culture 2: No organism on day 2 CMP     Component Value Date/Time   NA 137 11/09/2016 1328   K 3.2 (L) 11/09/2016 1328   CL 101 11/09/2016 1328   CO2 30 11/09/2016 1328   GLUCOSE 114 (H) 11/09/2016 1328   BUN 5 (L) 11/09/2016 1328   CREATININE 0.87 11/09/2016 1328   CALCIUM 8.6 (L) 11/09/2016 1328   PROT 6.6 11/09/2016 1328   ALBUMIN 3.2 (L) 11/09/2016 1328   AST 20 11/09/2016 1328   ALT 33 11/09/2016 1328   ALKPHOS 51 11/09/2016 1328   BILITOT 0.5 11/09/2016 1328   GFRNONAA >60 11/09/2016 1328   GFRAA >60 11/09/2016 1328  Chest x-ray: Negative CT of the abdomen and pelvis 4/12: No acute process  Assessment and plan: 41 year old obese patient postoperative day 5 from robotic-assisted total laparoscopic hysterectomy and bilateral salpingectomy now hospital  day 3 on a readmission for fever of unknown origin. Patient currently being covered with Zosyn. Abdominal/ pelvic  CT scan negative, chest x-ray negative, urine and blood cultures negative. White blood cell count improved today from yesterday and overall patient feeling better. However she has still had fevers over the last 24 hours. Exam is nonfocal. As no clear etiology we'll continue Zosyn. Remain inpatient. If fevers continue will  consider repeat CT abd/pelvic scan. Can also consider CT angio to  evaluate for PE given postoperative status and morbid obesity. Patient currently on prophylactic Lovenox. If blood cultures are positive will treat those and can also consider echocardiogram.   Starr Urias A. 11/09/2016 2:22 PM

## 2016-11-10 ENCOUNTER — Inpatient Hospital Stay (HOSPITAL_COMMUNITY): Payer: Medicaid Other

## 2016-11-10 ENCOUNTER — Encounter (HOSPITAL_COMMUNITY): Payer: Self-pay | Admitting: Radiology

## 2016-11-10 LAB — CBC WITH DIFFERENTIAL/PLATELET
BASOS ABS: 0 10*3/uL (ref 0.0–0.1)
Basophils Relative: 0 %
EOS ABS: 0.3 10*3/uL (ref 0.0–0.7)
EOS PCT: 2 %
HCT: 31.6 % — ABNORMAL LOW (ref 36.0–46.0)
Hemoglobin: 10.1 g/dL — ABNORMAL LOW (ref 12.0–15.0)
Lymphocytes Relative: 19 %
Lymphs Abs: 2.4 10*3/uL (ref 0.7–4.0)
MCH: 25.1 pg — AB (ref 26.0–34.0)
MCHC: 32 g/dL (ref 30.0–36.0)
MCV: 78.4 fL (ref 78.0–100.0)
MONO ABS: 0.6 10*3/uL (ref 0.1–1.0)
Monocytes Relative: 5 %
Neutro Abs: 9.3 10*3/uL — ABNORMAL HIGH (ref 1.7–7.7)
Neutrophils Relative %: 74 %
PLATELETS: 353 10*3/uL (ref 150–400)
RBC: 4.03 MIL/uL (ref 3.87–5.11)
RDW: 14.3 % (ref 11.5–15.5)
WBC: 12.6 10*3/uL — AB (ref 4.0–10.5)

## 2016-11-10 LAB — URINE CULTURE: Culture: NO GROWTH

## 2016-11-10 MED ORDER — FLUCONAZOLE 150 MG PO TABS
150.0000 mg | ORAL_TABLET | Freq: Once | ORAL | Status: AC
  Administered 2016-11-10: 150 mg via ORAL
  Filled 2016-11-10: qty 1

## 2016-11-10 MED ORDER — CLONAZEPAM 0.5 MG PO TABS
1.0000 mg | ORAL_TABLET | Freq: Two times a day (BID) | ORAL | Status: DC | PRN
  Administered 2016-11-10: 1 mg via ORAL
  Filled 2016-11-10: qty 2

## 2016-11-10 MED ORDER — IOPAMIDOL (ISOVUE-300) INJECTION 61%
100.0000 mL | Freq: Once | INTRAVENOUS | Status: AC | PRN
  Administered 2016-11-10: 100 mL via INTRAVENOUS

## 2016-11-10 MED ORDER — IOPAMIDOL (ISOVUE-300) INJECTION 61%: 30.0000 mL | INTRAVENOUS | Status: AC

## 2016-11-10 NOTE — Progress Notes (Signed)
Hospital day #4, readmission for postoperative fever. Status post robotic-assisted total laparoscopic hysterectomy, bilateral salpingectomy on April 9   Subjective: Patient notes feeling better than yesterday and much better than admission. No further anxiety, no further chest pressure after being treated for anxiety last night (pt takes prn at home, but only rarely). No pain meds in 10 hrs. No fevers since 2p yesterday. Pt notes easy breathing.   Pt notes some feeling of vaginal pressure, no pain, just some pressure, mild itching. No d/c. No bleeding but did notice slight pink spotting with wiping. Pt notes large BM yesterday and improvement in gas and bloating since the BM. Pt notes no nausea or emesis but did not eat dinner last night or breakfast this am. Denies abdominal pain.    Patient denies constipation or diarrhea, no blood in her stool. Patient denies dysuria or hematuria. Patient states no vaginal bleeding. Patient has no pain in her legs or swelling of her legs.   Patient reports no cough, no sore throat no recent out of the country travel no known sick contacts. Patient has no history of cardiac disease.   Patient is a nonsmoker but does have diabetes hypertension and hypercholesterolemia. Patient has no known history of VTE and no family history of the VTE. Patient denies history of thyroid disorders.   PE: Vitals:   11/10/16 0400 11/10/16 0745 11/10/16 0950 11/10/16 1015  BP: 120/72  110/70   Pulse: 88  (!) 103   Resp: 16  16   Temp: 98.6 F (37 C) 99.7 F (37.6 C) 99.4 F (37.4 C) (!) 101.4 F (38.6 C)  TempSrc: Oral Oral Oral Oral  SpO2: 100%  97%   Weight:      Height:       Gen.: No acute distress laying in bed Cardiovascular: Regular rate and rhythm, no murmur Pulmonary: Lungs clear with good air movement, no crackles, no wheeze, no tachypnea Neck: No nodules no thyromegaly Abdomen: No right upper quadrant pain, obese, no tympany, no distention, no rebound or  guarding Incisions: Closed with Dermabond no bruising no erythema no incisional breakdown, no evidence of port site hernia GU: Deferred, Lower extremity: No edema, no warmth, no cords Back: No costovertebral angle tenderness  CBC Latest Ref Rng & Units 11/10/2016 11/09/2016 11/08/2016  WBC 4.0 - 10.5 K/uL 12.6(H) 11.4(H) 16.5(H)  Hemoglobin 12.0 - 15.0 g/dL 10.1(L) 10.0(L) 10.9(L)  Hematocrit 36.0 - 46.0 % 31.6(L) 31.1(L) 33.3(L)  Platelets 150 - 400 K/uL 353 332 326    Urine culture: Multiple organisms nonfocal Blood culture 2: No organism on day 2 CMP     Component Value Date/Time   NA 137 11/09/2016 1328   K 3.2 (L) 11/09/2016 1328   CL 101 11/09/2016 1328   CO2 30 11/09/2016 1328   GLUCOSE 114 (H) 11/09/2016 1328   BUN 5 (L) 11/09/2016 1328   CREATININE 0.87 11/09/2016 1328   CALCIUM 8.6 (L) 11/09/2016 1328   PROT 6.6 11/09/2016 1328   ALBUMIN 3.2 (L) 11/09/2016 1328   AST 20 11/09/2016 1328   ALT 33 11/09/2016 1328   ALKPHOS 51 11/09/2016 1328   BILITOT 0.5 11/09/2016 1328   GFRNONAA >60 11/09/2016 1328   GFRAA >60 11/09/2016 1328  Chest x-ray: Negative CT of the abdomen and pelvis 4/12: No acute process  Assessment and plan: 41 year old obese patient postoperative day 6 from robotic-assisted total laparoscopic hysterectomy and bilateral salpingectomy now hospital day 4 on a readmission for fever of unknown origin. Patient  currently being covered with Zosyn. Abdominal/ pelvic  CT scan negative, chest x-ray negative, urine and blood cultures negative. White blood cell count  In normal range over past 2 days and pt appears improved and states she feels better. Still with decreased food intake. Pt had done about 20 hrs with no fever then had fevers during visit this am.  Exam is nonfocal. As no clear etiology we'll continue Zosyn. Remain inpatient. Plan repeat CT abd/pelvic scan. If that is negative can consider CT angio to evaluate for PE given postoperative status and morbid  obesity, though pt with nl O2 Sats and no longer with dyspnea. Will defer CT angio. Echo to r/o SBE also a consideration. Patient currently on prophylactic Lovenox. Septic pelvic thrombophlebitis also in DDx and CT should be helpful in assessing pelvic vascularity. Drug fever less likely as pt presented with fever though delayed reaction from intraop abx a possibile etiology of serum sickness, though pt with no rash.   Lorelle Macaluso A. 11/10/2016 12:09 PM

## 2016-11-10 NOTE — Progress Notes (Signed)
Patient ID: Jennifer Bean, female   DOB: 02-07-1976, 41 y.o.   MRN: [id number] CT Abdo/Pelvic with contrast negative.  No abnormal findings, fevers continue. Will consider ID consult if not better tomorrow

## 2016-11-11 LAB — CBC WITH DIFFERENTIAL/PLATELET
BASOS ABS: 0 10*3/uL (ref 0.0–0.1)
Basophils Relative: 0 %
EOS PCT: 2 %
Eosinophils Absolute: 0.3 10*3/uL (ref 0.0–0.7)
HEMATOCRIT: 30.2 % — AB (ref 36.0–46.0)
HEMOGLOBIN: 9.8 g/dL — AB (ref 12.0–15.0)
LYMPHS PCT: 14 %
Lymphs Abs: 1.8 10*3/uL (ref 0.7–4.0)
MCH: 25.3 pg — ABNORMAL LOW (ref 26.0–34.0)
MCHC: 32.5 g/dL (ref 30.0–36.0)
MCV: 78 fL (ref 78.0–100.0)
MONOS PCT: 9 %
Monocytes Absolute: 1.1 10*3/uL — ABNORMAL HIGH (ref 0.1–1.0)
NEUTROS ABS: 9.5 10*3/uL — AB (ref 1.7–7.7)
Neutrophils Relative %: 75 %
Platelets: 409 10*3/uL — ABNORMAL HIGH (ref 150–400)
RBC: 3.87 MIL/uL (ref 3.87–5.11)
RDW: 14.4 % (ref 11.5–15.5)
WBC: 12.7 10*3/uL — ABNORMAL HIGH (ref 4.0–10.5)

## 2016-11-11 NOTE — Progress Notes (Signed)
Patient ID: Jennifer Bean, female   DOB: [birth year], 41 y.o.   MRN: [id number] Intermittent high grade fevers continue despite normal exam and labs and CT abdo/pelvis Plan ID consult.

## 2016-11-11 NOTE — Progress Notes (Signed)
Dr. Linus Salmons contacted to be made aware of Dr. Gardiner Coins infectious disease consult request.

## 2016-11-11 NOTE — Progress Notes (Addendum)
Subjective: No complaints except decreased appetite and slight headaches when fever starts. Has daily bm, no vomiting or belching, no dysuria or pelvic pressure, Noted slight vaginal spotting once yesterday but none today. No LE pain /swelling. No SOB/ CP. Less anxiety. Ambulating and does not feel the need to take much Percocet.  Objective: BP 125/73 (BP Location: Right Arm)   Pulse (!) 107   Temp 98.2 F (36.8 C) (Oral)   Resp 18   Ht 5\' 2"  (1.575 m)   Wt 252 lb 4 oz (114.4 kg)   LMP 10/26/2016 (Exact Date) Comment: hysterectomy 11/04/16  SpO2 96%   BMI 46.14 kg/m  Last temps -- 100.5 at 4.30 pm 4/15 and 100.1 at 12.30 am this morning  Mild intermittent tachycardia persists, RR normal, BPs normal   Vital signs in last 24 hours: Temp:  [98.2 F (36.8 C)-100.5 F (38.1 C)] 98.2 F (36.8 C) (04/16 0849) Pulse Rate:  [89-107] 107 (04/16 0849) Resp:  [16-18] 18 (04/16 0849) BP: (116-125)/(56-95) 125/73 (04/16 0849) SpO2:  [96 %-100 %] 96 % (04/16 0849) Weight change:  Last BM Date: 11/10/16  General appearance: alert and cooperative Back: no tenderness to percussion or palpation Resp: clear to auscultation bilaterally Cardio: RRR, but intermittent tachycardia GI: soft, non-tender; bowel sounds normal; no masses,  no organomegaly Pelvic: vagina normal without discharge Extremities: extremities normal, atraumatic, no cyanosis or edema and Homans sign is negative, no sign of DVT Pulses: 2+ and symmetric Skin: Skin color, texture, turgor normal. No rashes or lesions Lymph nodes: Inguinal adenopathy: none Neurologic: Grossly normal Incision/Wound: all incisions healing well, no erythema, tenderness   Lab Results: CBC Latest Ref Rng & Units 11/11/2016 11/10/2016 11/09/2016  WBC 4.0 - 10.5 K/uL 12.7(H) 12.6(H) 11.4(H)  Hemoglobin 12.0 - 15.0 g/dL 9.8(L) 10.1(L) 10.0(L)  Hematocrit 36.0 - 46.0 % 30.2(L) 31.6(L) 31.1(L)  Platelets 150 - 400 K/uL 409(H) 353 332   Blood culture  negative x2, Urine culture (repeat) negative   Imaging - CT abdo/Pelvis with contrast on 4/12 and on 4/15 are both negative, no interval changes and no new findings   Medications:  Scheduled: . amLODipine  5 mg Oral Daily  . atorvastatin  20 mg Oral Daily  . enoxaparin (LOVENOX) injection  0.5 mg/kg Subcutaneous Q24H  . magnesium citrate  0.25 Bottle Oral Once  . metFORMIN  500 mg Oral BID WC  . pantoprazole  40 mg Oral Daily  . piperacillin-tazobactam  3.375 g Intravenous Q8H    Assessment/Plan:  LOS: 4 days , Robotic L/scopic hysterectomy on 08/31/95, uncomplicated surgery, Dc on 4/10 and readmission for fever on 4/12 night.  Surgery path - benign  Unclear source of fever, no pneumonia/ atelectasis, no abdominal/pelvic abscess or bowel obstruction/ no bacteremia or UTI/ no clinical VTE/ no pelvic septic thrombophlebitis. Only areas not imaged are head and heart and if no new fever today, will defer further testing, if fever recurs today, plan ID consult.  Pt counseled, understands and agrees. Hoping to go home today.   Neithan Day R 11/11/2016, 10:15 AM Patient ID: Jennifer Bean, female   DOB: Jul 27, 1976, 41 y.o.   MRN: [id number]

## 2016-11-12 DIAGNOSIS — R5082 Postprocedural fever: Principal | ICD-10-CM

## 2016-11-12 DIAGNOSIS — Z91040 Latex allergy status: Secondary | ICD-10-CM

## 2016-11-12 DIAGNOSIS — Z90710 Acquired absence of both cervix and uterus: Secondary | ICD-10-CM

## 2016-11-12 DIAGNOSIS — Z9049 Acquired absence of other specified parts of digestive tract: Secondary | ICD-10-CM

## 2016-11-12 DIAGNOSIS — Z9079 Acquired absence of other genital organ(s): Secondary | ICD-10-CM

## 2016-11-12 LAB — CULTURE, BLOOD (ROUTINE X 2)
CULTURE: NO GROWTH
CULTURE: NO GROWTH
Special Requests: ADEQUATE

## 2016-11-12 LAB — HIV ANTIBODY (ROUTINE TESTING W REFLEX): HIV Screen 4th Generation wRfx: NONREACTIVE

## 2016-11-12 NOTE — Progress Notes (Signed)
HD#6, post-op day 8 Subjective: No complaints, taking minimum Percocet, just 1 yesterday and 2 Tylenols.   Objective: Patient Vitals for the past 24 hrs:  BP Temp Temp src Pulse Resp SpO2  11/12/16 0825 100/66 98.7 F (37.1 C) Oral 100 18 97 %  11/12/16 0415 116/66 99.7 F (37.6 C) Oral (!) 103 18 95 %  11/12/16 0009 (!) 100/49 99.3 F (37.4 C) Oral (!) 101 18 93 %  11/11/16 1948 134/73 100.1 F (37.8 C) Oral (!) 101 18 98 %  11/11/16 1811 - 99.9 F (37.7 C) Oral - - -  11/11/16 1554 132/61 (!) 101.7 F (38.7 C) Oral 100 18 99 %  11/11/16 1221 130/67 98.3 F (36.8 C) Oral 90 18 100 %  11/11/16 0849 125/73 98.2 F (36.8 C) Oral (!) 107 18 96 %   General appearance: alert and cooperative Back: no tenderness to percussion or palpation Resp: clear to auscultation bilaterally Cardio: RRR, but intermittent tachycardia GI: soft, non-tender; bowel sounds normal; no masses,  no organomegaly Pelvic: vagina normal without discharge Extremities: extremities normal, atraumatic, no cyanosis or edema and Homans sign is negative, no sign of DVT Pulses: 2+ and symmetric Skin: Skin color, texture, turgor normal. No rashes or lesions Lymph nodes: Inguinal adenopathy: none Neurologic: Grossly normal Incision/Wound: all incisions healing well, no erythema, tenderness   CBC Latest Ref Rng & Units 11/11/2016 11/10/2016 11/09/2016  WBC 4.0 - 10.5 K/uL 12.7(H) 12.6(H) 11.4(H)  Hemoglobin 12.0 - 15.0 g/dL 9.8(L) 10.1(L) 10.0(L)  Hematocrit 36.0 - 46.0 % 30.2(L) 31.6(L) 31.1(L)  Platelets 150 - 400 K/uL 409(H) 353 332   Blood culture negative x2, Urine culture (repeat) negative   Imaging - CT abdo/Pelvis with contrast on 4/12 and on 4/15 are both negative, no interval changes and no new findings   Medications:  Scheduled: . amLODipine  5 mg Oral Daily  . atorvastatin  20 mg Oral Daily  . enoxaparin (LOVENOX) injection  0.5 mg/kg Subcutaneous Q24H  . magnesium citrate  0.25 Bottle Oral Once  .  metFORMIN  500 mg Oral BID WC  . pantoprazole  40 mg Oral Daily    Assessment/Plan:  LOS: 5 days , Robotic L/scopic hysterectomy on 10/04/86, uncomplicated surgery, Dc on 4/10 and readmission for fever on 4/12 night.  Surgery path - benign  ID consult appreciated, recommended to stop Zosyn and will reassess.   Cas Tracz R 11/12/2016, 8:43 AM Patient ID: Jennifer Bean, female   DOB: 1976-05-25, 41 y.o.   MRN: [id number]

## 2016-11-12 NOTE — Consult Note (Signed)
[place] [facility] for Infectious Disease       Reason for Consult: fever    Referring Physician: Dr. Benjie Karvonen  Active Problems:   Postoperative fever   . amLODipine  5 mg Oral Daily  . atorvastatin  20 mg Oral Daily  . enoxaparin (LOVENOX) injection  0.5 mg/kg Subcutaneous Q24H  . magnesium citrate  0.25 Bottle Oral Once  . metFORMIN  500 mg Oral BID WC  . pantoprazole  40 mg Oral Daily    Recommendations: I will stop pip/tazo Observe off of antibiotics  Routine HIV testing  Assessment: She has a daily fever now post op 8 days and up to 101.5 yesterday but no localizing symptoms.  Abdominal lap incisions closed, dry, no signs of infection.  No obvious bacterial origin so will stop the antibiotics.  CT scan ok, UA ok and no positive blood cultures.    Antibiotics: Pip-tazo day 7  HPI: Jennifer Bean is a 41 y.o. female s/p total laparoscopic hysterectomy and bilateral salpingectomy on 11/04/16 who came back to the hospital on 4/12 with fever and some abdominal pain.  CT scan and work up unrevealing with no abscess, no cellulitis or other localizing symptoms otherwise.   Initial temperature was 103 and this overall has trended down though did spike to 101.7 yesterday.  WBC has decreased from a peak of 16.5.  Blood cultures negative.  No sick contacts.  No diarrhea, some n/v without blood or bile.  History of cholecystectomy.   CT independently reviewed and no abscess noted.  Operative report reviewed from previous hospitalization and no complications  Review of Systems:  Constitutional: negative for fevers, chills and positive for anorexia Respiratory: negative for cough or sputum Integument/breast: negative for rash Musculoskeletal: negative for myalgias and arthralgias All other systems reviewed and are negative    Past Medical History:  Diagnosis Date  . Anxiety   . Diabetes mellitus without complication ([place])   . Dysmenorrhea 11/04/2016  . High cholesterol   .  Hypertension   . Sleep apnea    questionable had sleep study performed twice unsuccessful, needs to reschedule  . TIA (transient ischemic attack) 2014   no residual  . Vitamin D deficiency     Social History  Substance Use Topics  . Smoking status: Never Smoker  . Smokeless tobacco: Never Used  . Alcohol use Yes     Comment: social    History reviewed. No pertinent family history.  Allergies  Allergen Reactions  . Latex Other (See Comments)    Irritation and swelling Family history     Physical Exam: Constitutional: in no apparent distress and alert  Vitals:   11/12/16 0009 11/12/16 0415  BP: (!) 100/49 116/66  Pulse: (!) 101 (!) 103  Resp: 18 18  Temp: 99.3 F (37.4 C) 99.7 F (37.6 C)   EYES: anicteric ENMT: no thrush Cardiovascular: Cor RRR Respiratory: CTA B; normal respiratory effort GI: Bowel sounds are normal, liver is not enlarged, obese, nt, nd Musculoskeletal: no pedal edema noted Skin: negatives: no rash Hematologic: no cervical or supraclavicular lad  Lab Results  Component Value Date   WBC 12.7 (H) 11/11/2016   HGB 9.8 (L) 11/11/2016   HCT 30.2 (L) 11/11/2016   MCV 78.0 11/11/2016   PLT 409 (H) 11/11/2016    Lab Results  Component Value Date   CREATININE 0.87 11/09/2016   BUN 5 (L) 11/09/2016   NA 137 11/09/2016   K 3.2 (L) 11/09/2016  CL 101 11/09/2016   CO2 30 11/09/2016    Lab Results  Component Value Date   ALT 33 11/09/2016   AST 20 11/09/2016   ALKPHOS 51 11/09/2016     Microbiology: Recent Results (from the past 240 hour(s))  Urine culture     Status: Abnormal   Collection Time: 11/07/16 12:43 AM  Result Value Ref Range Status   Specimen Description URINE, CLEAN CATCH  Final   Special Requests Normal  Final   Culture MULTIPLE SPECIES PRESENT, SUGGEST RECOLLECTION (A)  Final   Report Status 11/08/2016 FINAL  Final  Culture, blood (routine x 2)     Status: None (Preliminary result)   Collection Time: 11/07/16  3:06 AM    Result Value Ref Range Status   Specimen Description BLOOD LEFT ARM  Final   Special Requests   Final    BOTTLES DRAWN AEROBIC AND ANAEROBIC Blood Culture adequate volume   Culture   Final    NO GROWTH 4 DAYS Performed at [facility], [address], [place], [place] [address]    Report Status PENDING  Incomplete  Culture, blood (routine x 2)     Status: None (Preliminary result)   Collection Time: 11/07/16  3:06 AM  Result Value Ref Range Status   Specimen Description BLOOD LEFT ARM  Final   Special Requests   Final    BOTTLES DRAWN AEROBIC AND ANAEROBIC Blood Culture results may not be optimal due to an excessive volume of blood received in culture bottles   Culture   Final    NO GROWTH 4 DAYS Performed at [facility], [place] [address], [place], [place] [address]    Report Status PENDING  Incomplete  Urine culture     Status: None   Collection Time: 11/08/16  8:00 PM  Result Value Ref Range Status   Specimen Description URINE, CLEAN CATCH  Final   Special Requests NONE  Final   Culture   Final    NO GROWTH Performed at [facility], [place] [address] [place], [place] [address]    Report Status 11/10/2016 FINAL  Final    Scharlene Gloss, [place] for Infectious Disease [facility] [email or web address] [phone] pager  [phone] cell 11/12/2016, 7:56 AM

## 2016-11-12 NOTE — Progress Notes (Signed)
Inpatient Diabetes Program Recommendations  AACE/ADA: New Consensus Statement on Inpatient Glycemic Control (2015)  Target Ranges:  Prepandial:   less than 140 mg/dL      Peak postprandial:   less than 180 mg/dL (1-2 hours)      Critically ill patients:  140 - 180 mg/dL  Results for LOLAH, COGHLAN (MRN [id number]) as of 11/12/2016 07:09  Ref. Range 11/09/2016 09:04  Glucose-Capillary Latest Ref Range: 65 - 99 mg/dL 122 (H)   Results for BRYSSA, TONES (MRN [id number]) as of 11/12/2016 07:09  Ref. Range 11/09/2016 13:28  Glucose Latest Ref Range: 65 - 99 mg/dL 114 (H)   Review of Glycemic Control  Diabetes history: DM2 Outpatient Diabetes medications: Metformin 500 mg BID Current orders for Inpatient glycemic control: Metformin 500 mg BID  Inpatient Diabetes Program Recommendations: Correction (SSI): While inpatient, please consider using Glycemic Control order set to order CBGs with Novolog 0-9 units TID with meals and Novolog 0-5 units QHS for bedtime correction. Diet: Please consider discontinuing Regular diet and ordering Carb Modified diet.  NOTE: Patient is receiving Metformin as an inpatient and glucose is not being monitored consistently. While inpatient, recommend ordering CBGs with Novolog correction scale.  Thanks, Barnie Alderman, RN, MSN, CDE Diabetes Coordinator Inpatient Diabetes Program [phone] (Team Pager from 8am to 5pm)

## 2016-11-13 ENCOUNTER — Encounter (HOSPITAL_COMMUNITY): Payer: Self-pay | Admitting: Obstetrics & Gynecology

## 2016-11-13 DIAGNOSIS — R509 Fever, unspecified: Secondary | ICD-10-CM

## 2016-11-13 DIAGNOSIS — R197 Diarrhea, unspecified: Secondary | ICD-10-CM

## 2016-11-13 HISTORY — DX: Fever, unspecified: R50.9

## 2016-11-13 LAB — C DIFFICILE QUICK SCREEN W PCR REFLEX
C DIFFICLE (CDIFF) ANTIGEN: NEGATIVE
C Diff interpretation: NOT DETECTED
C Diff toxin: NEGATIVE

## 2016-11-13 MED ORDER — OXYCODONE-ACETAMINOPHEN 5-325 MG PO TABS: 1.0000 | ORAL_TABLET | Freq: Three times a day (TID) | ORAL | 0 refills | Status: DC | PRN

## 2016-11-13 NOTE — Discharge Summary (Signed)
Physician Discharge Summary  Patient ID: Jennifer Bean MRN: [id number] DOB/AGE: October 04, 1975 41 y.o.  Admit date: 11/07/2016    Discharge date: 11/13/2016  Admission Diagnoses:  Postoperative fever, on post-op day 3. Readmission.   Discharge Diagnoses:  Pyrexia of unknown origin in a post-operative patient.  Discharged Condition: good  Consults: ID  Dr Luciana Axe (visits 4/17, 4/18)   Significant Diagnostic Studies:  CT abdomen/ pelvic 4/12 and 4/16//2018, Chest XRay 4/13, Urine and blood cultures 4/12 and urine repeat culture 4/14.   Hospital Course:  Amairany had an uncomplicated daVinci assisted hysterectomy on 11/04/16 and was discharged home on 11/05/16 with no complications and normal exam and feeling well. She called office on 4/12 evening with high temperature of 103 at home, was evaluated at [facility] hospital ER and had temp of 101 and tachycardia, nausea and not feeling well. She Sepsis protocol started by ER provider though patient was clinically stable. Zosyn started per protocol and prophylactic Lovenox started on 4/13 for VTE prevention.  Serial CBC, CMP done, Pelvis/abdominal CT scan with contrast that was negative (x2) . Urine and blood cultures x2 returned back negative in 48 hours, repeat urine culture also negative. Chest Xray was negative. WBC count trended down from 16 to 12 and stayed stable on 4/16.  Patient had slight nausea and emesis for 1 days and poor appetite was her only complaint with intermittent temp spikes of 101 and more. By 4/15 she was feeling well, exams remained normal with no source of fever. ID consult placed on 4/16 after repeat CT was normal and exams continued to be normal, non focal.  ID evaluation was normal and was advised to STOP ZOSYN, stopped on 4/17. Low grade temp return on 4/17 8 pm of 100.5 nothing higher. 4/18 morning patient complained of watery diarrhea x4-5 at night, AM stool was well formed per RN and C.diff was negative.  After consulting with  ID again, decision was made for discharge home. Possible viral/ unknown/ possible antibiotic reaction.    Discharge Exam: Blood pressure 132/84, pulse (!) 103, temperature 99.5 F (37.5 C), temperature source Oral, resp. rate 16, height 5\' 2"  (1.575 m), weight 252 lb 4 oz (114.4 kg), last menstrual period 10/26/2016, SpO2 97 %.   General appearance: alert and cooperative Back: no CVA tenderness Resp: clear to auscultation bilaterally Cardio: RRR, but intermittent tachycardia GI: soft, non-tender; bowel sounds normal; no masses,  no organomegaly Pelvic: vaginal walls normal pink, no erythema, no pelvic mass palpated over anterior vaginal wall or at the cuff on digital exam. No bleeding noted on the glove  Extremities: extremities normal, atraumatic, no cyanosis or edema and Homans sign is negative, no sign of DVT Neurologic: Grossly normal Incision/Wound: all incisions healing well, no erythema, tenderness   Disposition: 01-Home or Self Care  Discharge Instructions    Call MD for:    Complete by:  As directed    Vaginal bleeding that is more than just spotting   Call MD for:  difficulty breathing, headache or visual disturbances    Complete by:  As directed    Call MD for:  hives    Complete by:  As directed    Call MD for:  persistant dizziness or light-headedness    Complete by:  As directed    Call MD for:  persistant nausea and vomiting    Complete by:  As directed    Call MD for:  redness, tenderness, or signs of infection (pain, swelling, redness, odor or  green/yellow discharge around incision site)    Complete by:  As directed    Call MD for:  severe uncontrolled pain    Complete by:  As directed    Call MD for:  temperature >100.4    Complete by:  As directed    Diet - low sodium heart healthy    Complete by:  As directed    Discharge instructions    Complete by:  As directed    Continue all post-operative instruction you were given at discharge after surgery    Increase activity slowly    Complete by:  As directed    Lifting restrictions    Complete by:  As directed    20 pounds for 6 wks   Sexual Activity Restrictions    Complete by:  As directed    6 weeks     Allergies as of 11/13/2016      Reactions   Latex Other (See Comments)   Irritation and swelling Family history       Medication List    STOP taking these medications   clonazePAM 1 MG tablet Commonly known as:  KLONOPIN   docusate sodium 100 MG capsule Commonly known as:  COLACE     TAKE these medications   amLODipine 5 MG tablet Commonly known as:  NORVASC Take 5 mg by mouth daily.   atorvastatin 20 MG tablet Commonly known as:  LIPITOR Take 20 mg by mouth daily.   cholecalciferol 1000 units tablet Commonly known as:  VITAMIN D Take 1,000 Units by mouth daily.   gabapentin 300 MG capsule Commonly known as:  NEURONTIN Take 300 mg by mouth 3 (three) times daily.   ibuprofen 800 MG tablet Commonly known as:  ADVIL,MOTRIN Take 400-800 mg by mouth 2 (two) times daily as needed for moderate pain (takes 0.5-1 depending on pain level).   metFORMIN 500 MG tablet Commonly known as:  GLUCOPHAGE Take 500 mg by mouth 2 (two) times daily with a meal.   oxyCODONE-acetaminophen 5-325 MG tablet Commonly known as:  PERCOCET/ROXICET Take 1 tablet by mouth every 8 (eight) hours as needed for severe pain (moderate to severe pain (when tolerating fluids)). What changed:  how much to take  when to take this   PROAIR HFA 108 (90 Base) MCG/ACT inhaler Generic drug:  albuterol INHALE 2 PUFFS 4 TIMES DAILY AS NEEDED FOR SHORTNESS OF BREATH      Follow-up Information    Aylinn Rydberg R, MD Follow up in 1 week(s).   Specialty:  Obstetrics and Gynecology Contact information: [address] [place] [address] [phone]          Per Dr Luciana Axe, patient does not need ID follow up unless fever not resolved entirely after 1 week    Signed: Forrester Blando  R 11/13/2016, 1:11 PM

## 2016-11-13 NOTE — Progress Notes (Signed)
Pt ambulated out teaching complete  

## 2016-11-13 NOTE — Discharge Instructions (Signed)
Laparoscopically Assisted Vaginal Hysterectomy, Care After °Refer to this sheet in the next few weeks. These instructions provide you with information on caring for yourself after your procedure. Your health care provider may also give you more specific instructions. Your treatment has been planned according to current medical practices, but problems sometimes occur. Call your health care provider if you have any problems or questions after your procedure. °What can I expect after the procedure? °After your procedure, it is typical to have the following: °· Abdominal pain. You will be given pain medicine to control it. °· Sore throat from the breathing tube that was inserted during surgery. ° °Follow these instructions at home: °· Only take over-the-counter or prescription medicines for pain, discomfort, or fever as directed by your health care provider. °· Do not take aspirin. It can cause bleeding. °· Do not drive when taking pain medicine. °· Follow your health care provider's advice regarding diet, exercise, lifting, driving, and general activities. °· Resume your usual diet as directed and allowed. °· Get plenty of rest and sleep. °· Do not douche, use tampons, or have sexual intercourse for at least 6 weeks, or until your health care provider gives you permission. °· Change your bandages (dressings) as directed by your health care provider. °· Monitor your temperature and notify your health care provider of a fever. °· Take showers instead of baths for 2-3 weeks. °· Do not drink alcohol until your health care provider gives you permission. °· If you develop constipation, you may take a mild laxative with your health care provider's permission. Bran foods may help with constipation problems. Drinking enough fluids to keep your urine clear or pale yellow may help as well. °· Try to have someone home with you for 1-2 weeks to help around the house. °· Keep all of your follow-up appointments as directed by your  health care provider. °Contact a health care provider if: °· You have swelling, redness, or increasing pain around your incision sites. °· You have pus coming from your incision. °· You notice a bad smell coming from your incision. °· Your incision breaks open. °· You feel dizzy or lightheaded. °· You have pain or bleeding when you urinate. °· You have persistent diarrhea. °· You have persistent nausea and vomiting. °· You have abnormal vaginal discharge. °· You have a rash. °· You have any type of abnormal reaction or develop an allergy to your medicine. °· You have poor pain control with your prescribed medicine. °Get help right away if: °· You have a fever. °· You have severe abdominal pain. °· You have chest pain. °· You have shortness of breath. °· You faint. °· You have pain, swelling, or redness in your leg. °· You have heavy vaginal bleeding with blood clots. °This information is not intended to replace advice given to you by your health care provider. Make sure you discuss any questions you have with your health care provider. °Document Released: 07/04/2011 Document Revised: 12/21/2015 Document Reviewed: 01/28/2013 °Elsevier Interactive Patient Education © 2017 [employer] Inc[employer] ° °

## 2016-11-13 NOTE — Progress Notes (Signed)
HD# 6/7- fever  (Post-Op day 9, davinci hysterectomy, benign path 11/04/16) Subjective: 5-6 loose mostly watery diarrhea since last evening, last episode at 4 am, didn't inform night RN. Only pain is post-void, bladder/vaginal "spasm" like sharp pain but no dysuria/ flank pain/ low back pain/ frequency Reports feeling well   Objective: BP 98/64 (BP Location: Left Arm)   Pulse 95   Temp 98.4 F (36.9 C) (Oral)   Resp 16   Ht 5\' 2"  (1.575 m)   Wt 252 lb 4 oz (114.4 kg)   LMP 10/26/2016 (Exact Date) Comment: hysterectomy 11/04/16  SpO2 96%   BMI 46.14 kg/m   Tmax 100.5   Temp:  [98.4 F (36.9 C)-100.5 F (38.1 C)] 98.4 F (36.9 C) (04/18 0800) Pulse Rate:  [91-108] 95 (04/18 0800) Resp:  [16-18] 16 (04/18 0800) BP: (98-129)/(62-88) 98/64 (04/18 0800) SpO2:  [95 %-100 %] 96 % (04/18 0800)  General appearance: alert and cooperative Back: no CVA tenderness Resp: clear to auscultation bilaterally Cardio: RRR, but intermittent tachycardia GI: soft, non-tender; bowel sounds normal; no masses,  no organomegaly Pelvic: vaginal walls normal pink, no erythema, no pelvic mass palpated over anterior vaginal wall or at the cuff on digital exam. No bleeding noted on the glove  Extremities: extremities normal, atraumatic, no cyanosis or edema and Homans sign is negative, no sign of DVT Neurologic: Grossly normal Incision/Wound: all incisions healing well, no erythema, tenderness   CBC - last 11/11/16, stable. Blood culture negative x2, Urine culture (repeat) negative  Imaging - CT abdo/Pelvis with contrast on 4/12 and on 4/15 are both negative, no interval changes and no new findings   Medications:  Scheduled: . amLODipine  5 mg Oral Daily  . atorvastatin  20 mg Oral Daily  . enoxaparin (LOVENOX) injection  0.5 mg/kg Subcutaneous Q24H  . magnesium citrate  0.25 Bottle Oral Once  . metFORMIN  500 mg Oral BID WC  . pantoprazole  40 mg Oral Daily   Stopped Zosyn  yesterday  Assessment/Plan:  LOS: 6 days , Robotic L/scopic hysterectomy on 09/27/26, uncomplicated surgery, Dc on 4/10 and readmission for fever on 4/12 night.  Surgery path - benign  ID consult appreciated Stopped Zosyn yesterday morning. Last temp 100.5 at 8 pm last night.  New onset diarrhea- check C.diff  Reviewed with patient option of discharge home if ID agrees and plan outpatient f/up with me and ID team to f/up on fever work-up if needed as she is not getting any IV meds and is tolerating diet, pain is well controlled and last TMax 100.5. She agrees.   Jennifer Bean 11/13/2016, 8:36 AM Patient ID: Jennifer Bean, female   DOB: 1975-09-01, 41 y.o.   MRN: [id number]

## 2016-11-13 NOTE — Progress Notes (Signed)
[facility] for Infectious Disease   Reason for visit: Follow up on fever  Interval History: lowering fever curve, 100.5 max yesterday; some loose stools and C diff negative.  No new symptoms  Physical Exam: Constitutional:  Vitals:   11/13/16 0416 11/13/16 0800  BP: 129/88 98/64  Pulse: 91 95  Resp: 16 16  Temp: 98.4 F (36.9 C) 98.4 F (36.9 C)   patient appears in NAD Respiratory: Normal respiratory effort; CTA B Cardiovascular: RRR GI: soft, nt, nd  Review of Systems: Constitutional: negative for chills Respiratory: negative for cough Gastrointestinal: positive for diarrhea, negative for nausea  Lab Results  Component Value Date   WBC 12.7 (H) 11/11/2016   HGB 9.8 (L) 11/11/2016   HCT 30.2 (L) 11/11/2016   MCV 78.0 11/11/2016   PLT 409 (H) 11/11/2016    Lab Results  Component Value Date   CREATININE 0.87 11/09/2016   BUN 5 (L) 11/09/2016   NA 137 11/09/2016   K 3.2 (L) 11/09/2016   CL 101 11/09/2016   CO2 30 11/09/2016    Lab Results  Component Value Date   ALT 33 11/09/2016   AST 20 11/09/2016   ALKPHOS 51 11/09/2016     Microbiology: Recent Results (from the past 240 hour(s))  Urine culture     Status: Abnormal   Collection Time: 11/07/16 12:43 AM  Result Value Ref Range Status   Specimen Description URINE, CLEAN CATCH  Final   Special Requests Normal  Final   Culture MULTIPLE SPECIES PRESENT, SUGGEST RECOLLECTION (A)  Final   Report Status 11/08/2016 FINAL  Final  Culture, blood (routine x 2)     Status: None   Collection Time: 11/07/16  3:06 AM  Result Value Ref Range Status   Specimen Description BLOOD LEFT ARM  Final   Special Requests   Final    BOTTLES DRAWN AEROBIC AND ANAEROBIC Blood Culture adequate volume   Culture   Final    NO GROWTH 5 DAYS Performed at [facility], [address] [place], [place] [address]    Report Status 11/12/2016 FINAL  Final  Culture, blood (routine x 2)     Status: None   Collection Time:  11/07/16  3:06 AM  Result Value Ref Range Status   Specimen Description BLOOD LEFT ARM  Final   Special Requests   Final    BOTTLES DRAWN AEROBIC AND ANAEROBIC Blood Culture results may not be optimal due to an excessive volume of blood received in culture bottles   Culture   Final    NO GROWTH 5 DAYS Performed at [facility], [place] [address] [place], [place] [address]    Report Status 11/12/2016 FINAL  Final  Urine culture     Status: None   Collection Time: 11/08/16  8:00 PM  Result Value Ref Range Status   Specimen Description URINE, CLEAN CATCH  Final   Special Requests NONE  Final   Culture   Final    NO GROWTH Performed at [facility], [place] [address], [place], [place] [address]    Report Status 11/10/2016 FINAL  Final  C difficile quick scan w PCR reflex     Status: None   Collection Time: 11/13/16  9:45 AM  Result Value Ref Range Status   C Diff antigen NEGATIVE NEGATIVE Final   C Diff toxin NEGATIVE NEGATIVE Final   C Diff interpretation No C. difficile detected.  Final  Impression/Plan:  1. Fever - likely viral in origin lowering fever curve.  No antibiotics indicated and has been off over 24 hours.  Continue supportive care  2. Diarrhea - may be antibiotic assoicated (not C diff) or part of viral illness.  Continue supportive care.  Ok from [place] standpoint for dc.  Will not follow up as outpatient unless fever persists over another 1 week.   Thanks for consultation.

## 2016-11-13 NOTE — Progress Notes (Signed)
Patient ID: Jennifer Bean, female   DOB: 1975-10-06, 41 y.o.   MRN: [id number] ID consult and recommendations appreciated Post-op fever but no cause identified, possible viral/ possible antibiotic related?   Discharge home f/up with me in office in 1 week.  Warning s/s reviewed/

## 2017-03-14 ENCOUNTER — Emergency Department (HOSPITAL_COMMUNITY)
Admission: EM | Admit: 2017-03-14 | Discharge: 2017-03-14 | Disposition: A | Discharge status: Home / Self Care | Payer: Medicaid Other | Attending: Emergency Medicine | Admitting: Emergency Medicine

## 2017-03-14 ENCOUNTER — Emergency Department (HOSPITAL_COMMUNITY): Payer: Medicaid Other

## 2017-03-14 DIAGNOSIS — Z91040 Latex allergy status: Secondary | ICD-10-CM | POA: Insufficient documentation

## 2017-03-14 DIAGNOSIS — Y929 Unspecified place or not applicable: Secondary | ICD-10-CM | POA: Diagnosis not present

## 2017-03-14 DIAGNOSIS — M79672 Pain in left foot: Secondary | ICD-10-CM | POA: Diagnosis present

## 2017-03-14 DIAGNOSIS — E119 Type 2 diabetes mellitus without complications: Secondary | ICD-10-CM | POA: Diagnosis not present

## 2017-03-14 DIAGNOSIS — I10 Essential (primary) hypertension: Secondary | ICD-10-CM | POA: Insufficient documentation

## 2017-03-14 DIAGNOSIS — Z7984 Long term (current) use of oral hypoglycemic drugs: Secondary | ICD-10-CM | POA: Diagnosis not present

## 2017-03-14 DIAGNOSIS — E7800 Pure hypercholesterolemia, unspecified: Secondary | ICD-10-CM | POA: Insufficient documentation

## 2017-03-14 DIAGNOSIS — Y998 Other external cause status: Secondary | ICD-10-CM | POA: Insufficient documentation

## 2017-03-14 DIAGNOSIS — Z8673 Personal history of transient ischemic attack (TIA), and cerebral infarction without residual deficits: Secondary | ICD-10-CM | POA: Insufficient documentation

## 2017-03-14 DIAGNOSIS — Y33XXXA Other specified events, undetermined intent, initial encounter: Secondary | ICD-10-CM | POA: Insufficient documentation

## 2017-03-14 DIAGNOSIS — Z79899 Other long term (current) drug therapy: Secondary | ICD-10-CM | POA: Diagnosis not present

## 2017-03-14 DIAGNOSIS — M25572 Pain in left ankle and joints of left foot: Secondary | ICD-10-CM

## 2017-03-14 DIAGNOSIS — Y939 Activity, unspecified: Secondary | ICD-10-CM | POA: Insufficient documentation

## 2017-03-14 MED ORDER — OXYCODONE-ACETAMINOPHEN 5-325 MG PO TABS
ORAL_TABLET | ORAL | Status: DC
Start: 2017-03-14 — End: 2017-03-14
  Filled 2017-03-14: qty 1

## 2017-03-14 MED ORDER — NAPROXEN 500 MG PO TABS: 500.0000 mg | ORAL_TABLET | Freq: Two times a day (BID) | ORAL | 0 refills | Status: DC

## 2017-03-14 MED ORDER — OXYCODONE-ACETAMINOPHEN 5-325 MG PO TABS
1.0000 | ORAL_TABLET | ORAL | Status: DC | PRN
Start: 2017-03-14 — End: 2017-03-14
  Administered 2017-03-14: 1 via ORAL

## 2017-03-14 NOTE — ED Provider Notes (Signed)
[facility] DEPT Provider Note   CSN: [id number] Arrival date & time: 03/14/17  1025     History   Chief Complaint Chief Complaint  Patient presents with  . Foot Pain    HPI Jennifer Bean is a 41 y.o. female.  The history is provided by the patient and medical records.     41 year old female with history of anxiety, diabetes, hyperlipidemia, hypertension, sleep apnea, vitamin D deficiency, presenting to the ED for left ankle and foot pain. States she was helping her daughter yesterday and that off a curb wrong and twisted her ankle. States he iced and elevated her ankle and foot last night, but continues having worsening swelling. States she took her daughter to the dentist this morning and stepped wrong again causing increased pain and swelling from last night. She denies any numbness or weakness of the left ankle or foot. No prior surgeries of this area. She was given some pain medicine in triage which greatly improved her symptoms.  Past Medical History:  Diagnosis Date  . Anxiety   . Diabetes mellitus without complication ([place])   . Dysmenorrhea 11/04/2016  . High cholesterol   . Hypertension   . Pyrexia of unknown origin 11/13/2016  . Sleep apnea    questionable had sleep study performed twice unsuccessful, needs to reschedule  . TIA (transient ischemic attack) 2014   no residual  . Vitamin D deficiency     Patient Active Problem List   Diagnosis Date Noted  . Pyrexia of unknown origin 11/13/2016  . Postoperative fever 11/07/2016  . Status post laparoscopic hysterectomy 11/04/2016    Past Surgical History:  Procedure Laterality Date  . CHOLECYSTECTOMY    . ROBOTIC ASSISTED TOTAL HYSTERECTOMY WITH SALPINGECTOMY Bilateral 11/04/2016   Procedure: ROBOTIC ASSISTED TOTAL HYSTERECTOMY WITH SALPINGECTOMY;  Surgeon: Azucena Fallen, MD;  Location: [facility];  Service: Gynecology;  Laterality: Bilateral;  . TUBAL LIGATION      OB History    No data available        Home Medications    Prior to Admission medications   Medication Sig Start Date End Date Taking? Authorizing Provider  amLODipine (NORVASC) 5 MG tablet Take 5 mg by mouth daily. 09/11/16   [provider]  atorvastatin (LIPITOR) 20 MG tablet Take 20 mg by mouth daily.    [provider]  cholecalciferol (VITAMIN D) 1000 units tablet Take 1,000 Units by mouth daily.    [provider]  gabapentin (NEURONTIN) 300 MG capsule Take 300 mg by mouth 3 (three) times daily.    [provider]  ibuprofen (ADVIL,MOTRIN) 800 MG tablet Take 400-800 mg by mouth 2 (two) times daily as needed for moderate pain (takes 0.5-1 depending on pain level).     [provider]  metFORMIN (GLUCOPHAGE) 500 MG tablet Take 500 mg by mouth 2 (two) times daily with a meal.    [provider]  oxyCODONE-acetaminophen (PERCOCET/ROXICET) 5-325 MG tablet Take 1 tablet by mouth every 8 (eight) hours as needed for severe pain (moderate to severe pain (when tolerating fluids)). 11/13/16   Azucena Fallen, MD  PROAIR HFA 108 [phone] Base) MCG/ACT inhaler INHALE 2 PUFFS 4 TIMES DAILY AS NEEDED FOR SHORTNESS OF BREATH 08/16/16   [provider]    Family History No family history on file.  Social History Social History  Substance Use Topics  . Smoking status: Never Smoker  . Smokeless tobacco: Never Used  . Alcohol use Yes  Comment: social     Allergies   Latex   Review of Systems Review of Systems  Musculoskeletal: Positive for arthralgias.  All other systems reviewed and are negative.    Physical Exam Updated Vital Signs BP (!) 154/99   Pulse 90   Temp 98.1 F (36.7 C)   Resp 18   Wt 114.3 kg (252 lb)   LMP 10/26/2016 (Exact Date) Comment: hysterectomy 11/04/16  SpO2 97%   BMI 46.09 kg/m   Physical Exam  Constitutional: She is oriented to person, place, and time. She appears well-developed and well-nourished.  HENT:  Head: Normocephalic  and atraumatic.  Mouth/Throat: Oropharynx is clear and moist.  Eyes: Pupils are equal, round, and reactive to light. Conjunctivae and EOM are normal.  Neck: Normal range of motion.  Cardiovascular: Normal rate, regular rhythm and normal heart sounds.   Pulmonary/Chest: Effort normal and breath sounds normal.  Abdominal: Soft. Bowel sounds are normal.  Musculoskeletal: Normal range of motion.  Moderate amount of swelling along lateral left foot and ankle extending from lateral malleolus to dorsal mid foot; no acute deformities; DP pulse intact; moving all toes normally; normal distal perfusion and sensation  Neurological: She is alert and oriented to person, place, and time.  Skin: Skin is warm and dry.  Psychiatric: She has a normal mood and affect.  Nursing note and vitals reviewed.    ED Treatments / Results  Labs (all labs ordered are listed, but only abnormal results are displayed) Labs Reviewed - No data to display  EKG  EKG Interpretation None       Radiology Dg Ankle 2 Views Left  Result Date: 03/14/2017 CLINICAL DATA:  Left ankle pain since the patient slipped off a curb yesterday. Initial encounter. EXAM: LEFT ANKLE - 2 VIEW COMPARISON:  Plain films left ankle 09/19/2008. FINDINGS: There is no evidence of fracture, dislocation, or joint effusion. There is no evidence of arthropathy or other focal bone abnormality. Soft tissues are unremarkable. IMPRESSION: Normal study. Electronically Signed   By: Inge Rise M.D.   On: 03/14/2017 14:15   Dg Foot Complete Left  Result Date: 03/14/2017 CLINICAL DATA:  Slipped off a curb yesterday, pain and swelling across the proximal metatarsals as well as inferior to lateral malleolus, unable to bear weight EXAM: LEFT FOOT - COMPLETE 3+ VIEW COMPARISON:  None FINDINGS: Osseous mineralization normal. Joint spaces preserved. No acute fracture, dislocation, or bone destruction. Dorsal LEFT foot soft tissue swelling. IMPRESSION: No acute  osseous abnormalities. Electronically Signed   By: Lavonia Dana M.D.   On: 03/14/2017 14:17    Procedures Procedures (including critical care time)  Medications Ordered in ED Medications  oxyCODONE-acetaminophen (PERCOCET/ROXICET) 5-325 MG per tablet 1 tablet (1 tablet Oral Given 03/14/17 1134)  oxyCODONE-acetaminophen (PERCOCET/ROXICET) 5-325 MG per tablet (not administered)     Initial Impression / Assessment and Plan / ED Course  I have reviewed the triage vital signs and the nursing notes.  Pertinent labs & imaging results that were available during my care of the patient were reviewed by me and considered in my medical decision making (see chart for details).  41 year old female here with left ankle and foot pain after injuring herself while helping her daughter move yesterday. Reports pain and swelling, worse after "stepping wrong" earlier today while at dentist with her daughter. There is swelling along the dorsal foot extending to lateral malleolus, there is no acute deformity, DP pulse intact, normal sensation and distal perfusion. Screening x-rays obtained and  are negative. Patient placed in ASO brace, given crutches.  Follow-up with PCP if any ongoing issues.  Discussed plan with patient, she acknowledged understanding and agreed with plan of care.  Return precautions given for new or worsening symptoms.  Final Clinical Impressions(s) / ED Diagnoses   Final diagnoses:  Left foot pain  Acute left ankle pain    New Prescriptions Discharge Medication List as of 03/14/2017  4:01 PM    START taking these medications   Details  naproxen (NAPROSYN) 500 MG tablet Take 1 tablet (500 mg total) by mouth 2 (two) times daily with a meal., Starting Fri 03/14/2017, Print         Larene Pickett, PA-C 03/14/17 1615    Little, Wenda Overland, MD 03/15/17 2109

## 2017-03-14 NOTE — ED Triage Notes (Signed)
Pt presents to the ed after twisting her ankle yesterday while helping her daughter move. Complaints of pain in her left foot. Pt is ambulatory with difficulty due to pain. Cms intact.

## 2017-03-14 NOTE — Discharge Instructions (Signed)
Take the prescribed medication as directed. Follow-up with your primary care doctor if you have any ongoing issues. Return to the ED for new or worsening symptoms.

## 2017-03-14 NOTE — Progress Notes (Signed)
Orthopedic Tech Progress Note Patient Details:  Jennifer Bean 23-May-1976 [id number]  Ortho Devices Type of Ortho Device: ASO, Crutches Ortho Device/Splint Interventions: Application   [place] Pink 03/14/2017, 3:38 PM

## 2017-09-03 ENCOUNTER — Ambulatory Visit: Payer: Self-pay | Admitting: Psychology

## 2019-04-08 IMAGING — CT CT ABD-PELV W/ CM
2 of 5 series · 15 of 46 positions shown, 17 images · IV contrast (OMNIPAQUE)
Comparison: 11/07/2016 CT abdomen/pelvis.

CLINICAL DATA: Fever of unknown origin. Day 6 status post robotic
total abdominal hysterectomy and bilateral salpingectomy performed
for pain/dysmenorrhea/fibroid uterus.

EXAM:
CT ABDOMEN AND PELVIS WITH CONTRAST
TECHNIQUE: Multidetector CT imaging of the abdomen and pelvis was performed
using the standard protocol following bolus administration of
intravenous contrast.
CONTRAST:  100mL ZU20AD-GRR IOPAMIDOL (ZU20AD-GRR) INJECTION 61%

[Series 3: routine abdomen/pelvis with · axial · 0.96mm/px · z∈[+708,+1123]mm · 12 of 97 slices shown, 14 images]
[im 7/97  soft-tissue]
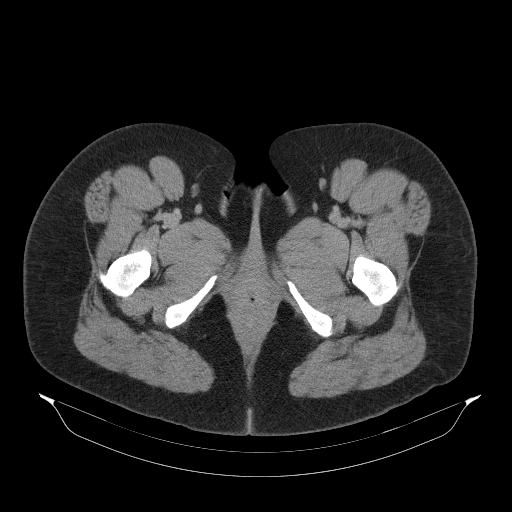
[im 7/97  bone]
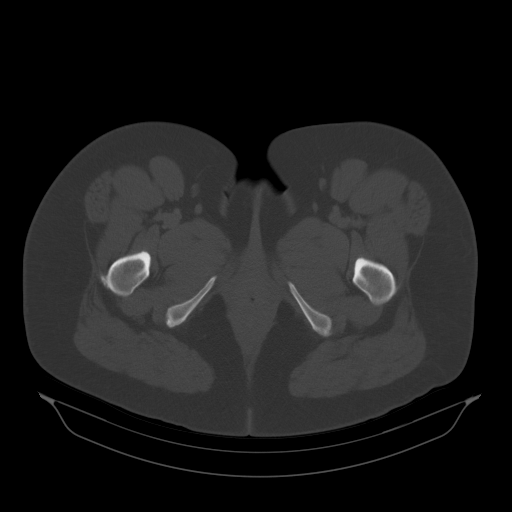
[im 13/97  soft-tissue]
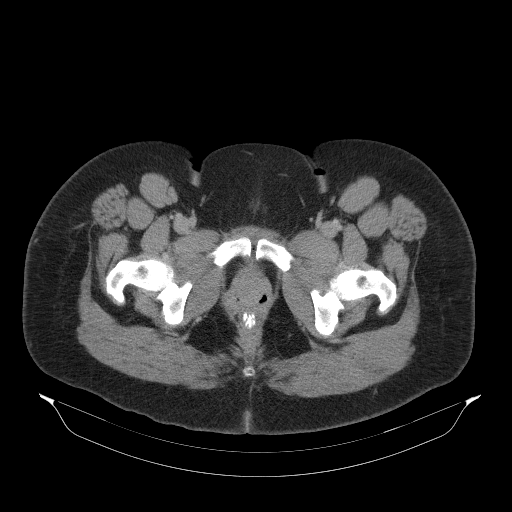
[im 20/97  soft-tissue]
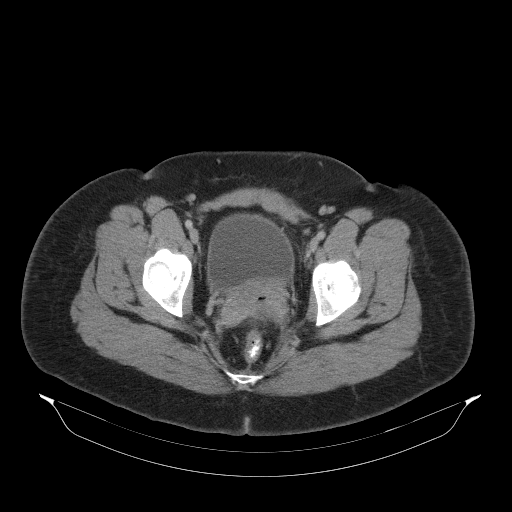
[im 33/97  soft-tissue]
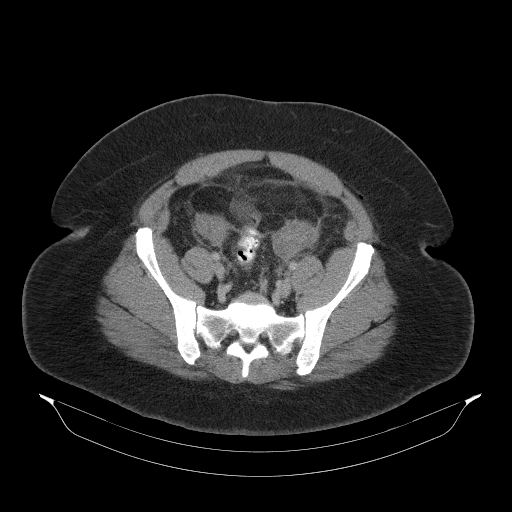
[im 39/97  soft-tissue]
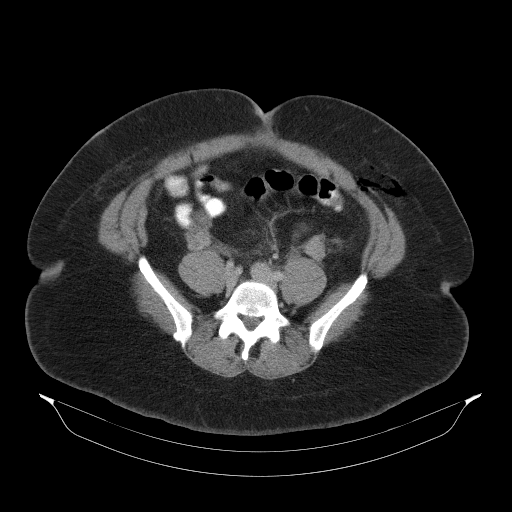
[im 45/97  soft-tissue]
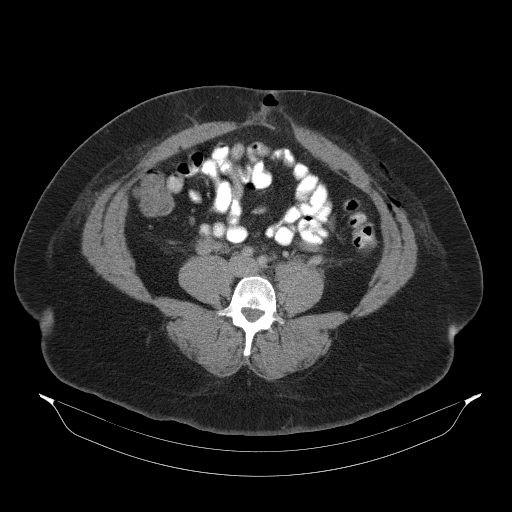
[im 52/97  soft-tissue]
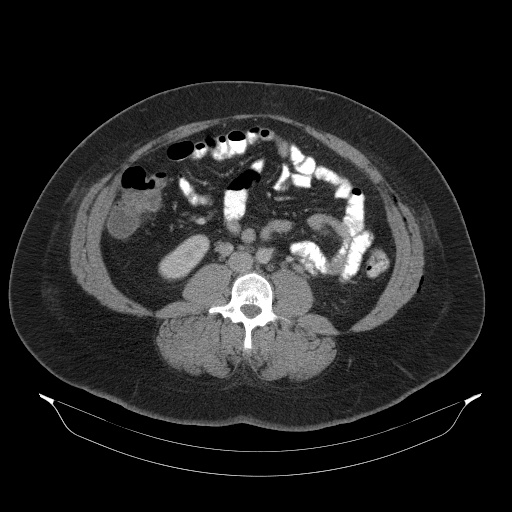
[im 58/97  soft-tissue]
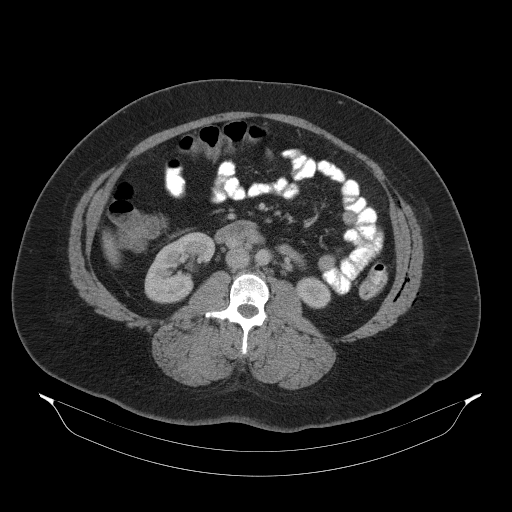
[im 65/97  soft-tissue]
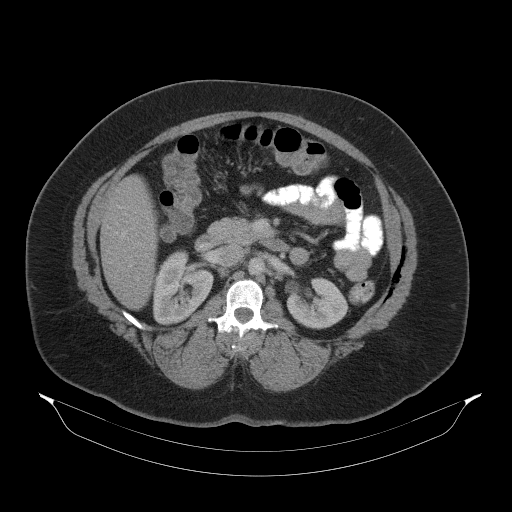
[im 65/97  bone]
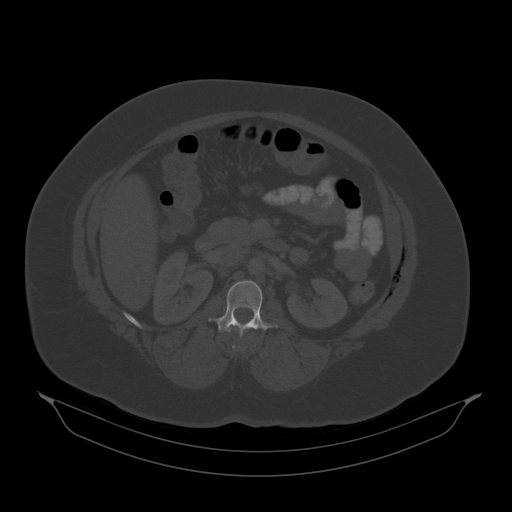
[im 77/97  soft-tissue]
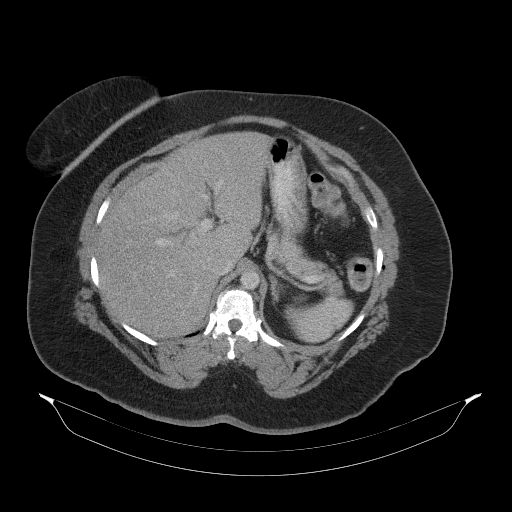
[im 84/97  soft-tissue]
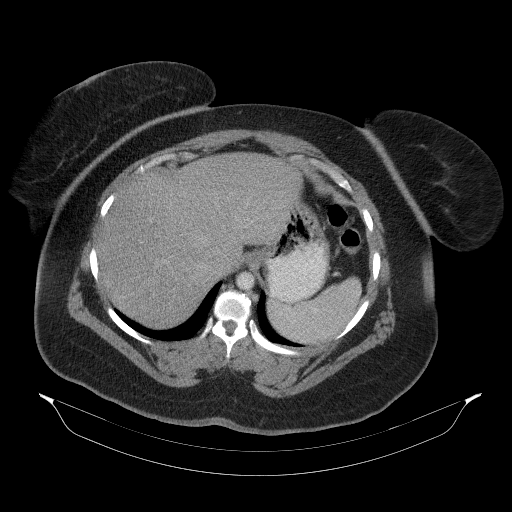
[im 90/97  soft-tissue]
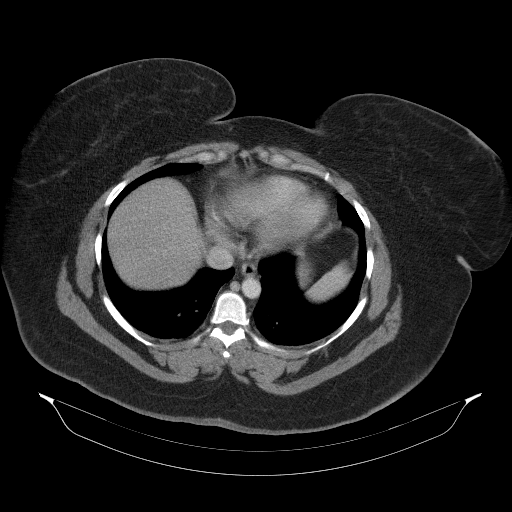

[Series 602: <mpr thick range> · coronal · 0.96mm/px · 3 of 149 slices shown]
[im 50/149  soft-tissue]
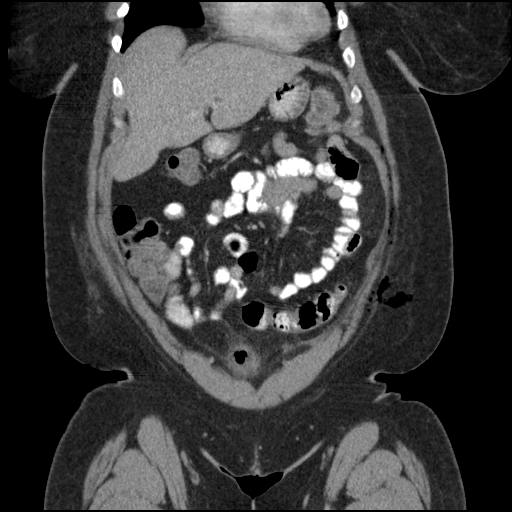
[im 66/149  soft-tissue]
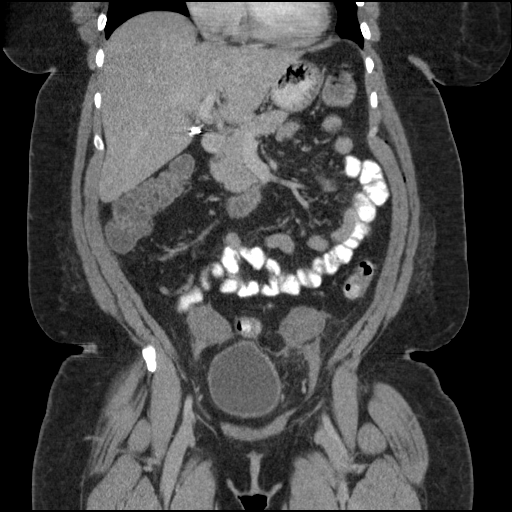
[im 83/149  soft-tissue]
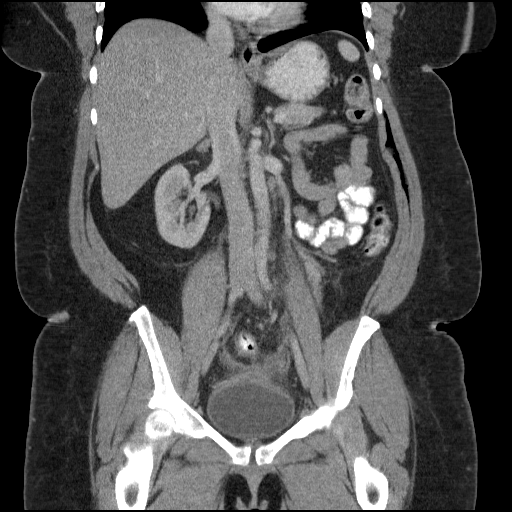

[15 of 46 positions shown; findings below may reference images not displayed]

FINDINGS: Lower chest: No significant pulmonary nodules or acute consolidative
airspace disease.

Hepatobiliary: Diffuse hepatic steatosis. No liver mass.
Cholecystectomy. Bile ducts are stable and within expected post
cholecystectomy limits.

Pancreas: Normal, with no mass or duct dilation.

Spleen: Normal size. No mass.

Adrenals/Urinary Tract: No right adrenal nodule. Left adrenal 1.2 cm
nodule (Series 3/ image 23) with indeterminate density 25 HU,
stable. Normal kidneys with no hydronephrosis and no renal mass. No
perinephric fluid collections. Normal caliber ureters, with no
evidence of a urine leak on the delayed views. Minimal nondependent
bladder gas, decreased. No definite bladder wall thickening.
Nonspecific 2 mm calcification in the posterior left bladder wall,
stable.

Stomach/Bowel: Grossly normal stomach. Normal caliber small bowel
with no small bowel wall thickening. Normal appendix . Normal large
bowel with no diverticulosis, large bowel wall thickening or
pericolonic fat stranding. Oral contrast progresses to the distal
rectum.

Vascular/Lymphatic: Normal caliber abdominal aorta. Patent portal,
splenic, hepatic and renal veins. Newly mildly enlarged 1.1 cm right
pelvic sidewall node (series 3/ image 74). No additional
pathologically enlarged abdominopelvic nodes.

Reproductive: Status posthysterectomy. Fat stranding and ill-defined
fluid at the vaginal cuff appears unchanged. Decreased mild
nonspecific gas in the vagina. Ovaries appear symmetric in size with
no adnexal masses. No discrete pelvic fluid collections.

Other: No pneumoperitoneum. No ascites. No focal abdominal fluid
collections. Expected postoperative gas in the subcutaneous and
muscular ventral left abdominal wall is mildly decreased in the
interval. Small fat containing left periumbilical hernia is stable.
Mild gas at the umbilical port site, slightly decreased. No
superficial fluid collections.

Musculoskeletal: No aggressive appearing focal osseous lesions.
IMPRESSION: 1. Stable fat stranding and ill-defined fluid at the vaginal cuff.
No discrete measurable / drainable abdominopelvic abscess.
2. Decreased postoperative gas in the umbilical port site, ventral
left abdominal wall, vagina and bladder.
3. No evidence of a urine leak.  No hydronephrosis.
4. Mild right pelvic sidewall adenopathy, new, nonspecific, probably
reactive.
5. Diffuse hepatic steatosis.
6. Stable 1.2 cm left adrenal nodule, probably a benign adenoma.
Consider a follow-up adrenal protocol CT abdomen without and with IV
contrast in 12 months. This recommendation follows ACR consensus
guidelines: Management of Incidental Adrenal Masses: A White Paper
of the ACR Incidental Findings Committee. [HOSPITAL]

## 2020-03-13 ENCOUNTER — Institutional Professional Consult (permissible substitution): Payer: Medicare Other | Admitting: Internal Medicine

## 2020-04-17 ENCOUNTER — Institutional Professional Consult (permissible substitution): Payer: Medicare Other | Admitting: Pulmonary Disease

## 2020-05-03 ENCOUNTER — Institutional Professional Consult (permissible substitution): Payer: Medicare Other | Admitting: Pulmonary Disease

## 2021-03-14 ENCOUNTER — Emergency Department (HOSPITAL_COMMUNITY): Payer: Medicare Other

## 2021-03-14 ENCOUNTER — Other Ambulatory Visit: Payer: Self-pay

## 2021-03-14 ENCOUNTER — Encounter (HOSPITAL_COMMUNITY): Payer: Self-pay | Admitting: Emergency Medicine

## 2021-03-14 ENCOUNTER — Emergency Department (HOSPITAL_COMMUNITY)
Admission: EM | Admit: 2021-03-14 | Discharge: 2021-03-14 | Disposition: A | Discharge status: Home / Self Care | Payer: Medicare Other | Attending: Emergency Medicine | Admitting: Emergency Medicine

## 2021-03-14 DIAGNOSIS — Z91040 Latex allergy status: Secondary | ICD-10-CM | POA: Insufficient documentation

## 2021-03-14 DIAGNOSIS — Z20822 Contact with and (suspected) exposure to covid-19: Secondary | ICD-10-CM | POA: Insufficient documentation

## 2021-03-14 DIAGNOSIS — E1169 Type 2 diabetes mellitus with other specified complication: Secondary | ICD-10-CM | POA: Diagnosis not present

## 2021-03-14 DIAGNOSIS — X58XXXA Exposure to other specified factors, initial encounter: Secondary | ICD-10-CM | POA: Insufficient documentation

## 2021-03-14 DIAGNOSIS — M25572 Pain in left ankle and joints of left foot: Secondary | ICD-10-CM | POA: Insufficient documentation

## 2021-03-14 DIAGNOSIS — I10 Essential (primary) hypertension: Secondary | ICD-10-CM | POA: Diagnosis not present

## 2021-03-14 DIAGNOSIS — E7800 Pure hypercholesterolemia, unspecified: Secondary | ICD-10-CM | POA: Insufficient documentation

## 2021-03-14 DIAGNOSIS — Z7984 Long term (current) use of oral hypoglycemic drugs: Secondary | ICD-10-CM | POA: Insufficient documentation

## 2021-03-14 DIAGNOSIS — M7989 Other specified soft tissue disorders: Secondary | ICD-10-CM | POA: Diagnosis not present

## 2021-03-14 DIAGNOSIS — S99912A Unspecified injury of left ankle, initial encounter: Secondary | ICD-10-CM | POA: Diagnosis not present

## 2021-03-14 DIAGNOSIS — Z79899 Other long term (current) drug therapy: Secondary | ICD-10-CM | POA: Diagnosis not present

## 2021-03-14 DIAGNOSIS — S92152A Displaced avulsion fracture (chip fracture) of left talus, initial encounter for closed fracture: Secondary | ICD-10-CM | POA: Diagnosis not present

## 2021-03-14 LAB — CBC WITH DIFFERENTIAL/PLATELET
Abs Immature Granulocytes: 0.03 10*3/uL (ref 0.00–0.07)
Basophils Absolute: 0 10*3/uL (ref 0.0–0.1)
Basophils Relative: 0 %
Eosinophils Absolute: 0.1 10*3/uL (ref 0.0–0.5)
Eosinophils Relative: 1 %
HCT: 41.1 % (ref 36.0–46.0)
Hemoglobin: 13.2 g/dL (ref 12.0–15.0)
Immature Granulocytes: 0 %
Lymphocytes Relative: 46 %
Lymphs Abs: 3.2 10*3/uL (ref 0.7–4.0)
MCH: 26.1 pg (ref 26.0–34.0)
MCHC: 32.1 g/dL (ref 30.0–36.0)
MCV: 81.2 fL (ref 80.0–100.0)
Monocytes Absolute: 0.5 10*3/uL (ref 0.1–1.0)
Monocytes Relative: 7 %
Neutro Abs: 3.3 10*3/uL (ref 1.7–7.7)
Neutrophils Relative %: 46 %
Platelets: 378 10*3/uL (ref 150–400)
RBC: 5.06 MIL/uL (ref 3.87–5.11)
RDW: 14 % (ref 11.5–15.5)
WBC: 7.1 10*3/uL (ref 4.0–10.5)
nRBC: 0 % (ref 0.0–0.2)

## 2021-03-14 LAB — COMPREHENSIVE METABOLIC PANEL
ALT: 30 U/L (ref 0–44)
AST: 17 U/L (ref 15–41)
Albumin: 4.1 g/dL (ref 3.5–5.0)
Alkaline Phosphatase: 62 U/L (ref 38–126)
Anion gap: 9 (ref 5–15)
BUN: 10 mg/dL (ref 6–20)
CO2: 27 mmol/L (ref 22–32)
Calcium: 9.1 mg/dL (ref 8.9–10.3)
Chloride: 102 mmol/L (ref 98–111)
Creatinine, Ser: 0.67 mg/dL (ref 0.44–1.00)
GFR, Estimated: >60 mL/min (ref >60)
Glucose, Bld: 201 mg/dL — ABNORMAL HIGH (ref 70–99)
Potassium: 3.6 mmol/L (ref 3.5–5.1)
Sodium: 138 mmol/L (ref 135–145)
Total Bilirubin: 0.4 mg/dL (ref 0.3–1.2)
Total Protein: 7.4 g/dL (ref 6.5–8.1)

## 2021-03-14 LAB — LIPASE, BLOOD: Lipase: 44 U/L (ref 11–51)

## 2021-03-14 LAB — RESP PANEL BY RT-PCR (FLU A&B, COVID) ARPGX2
Influenza A by PCR: NEGATIVE
Influenza B by PCR: NEGATIVE
SARS Coronavirus 2 by RT PCR: NEGATIVE

## 2021-03-14 MED ORDER — SUCRALFATE 1 G PO TABS: 1.0000 g | ORAL_TABLET | Freq: Three times a day (TID) | ORAL | 0 refills | Status: DC

## 2021-03-14 MED ORDER — SUCRALFATE 1 G PO TABS
1.0000 g | ORAL_TABLET | Freq: Once | ORAL | Status: AC
  Administered 2021-03-14: 1 g via ORAL
  Filled 2021-03-14: qty 1

## 2021-03-14 MED ORDER — AMLODIPINE BESYLATE 5 MG PO TABS
5.0000 mg | ORAL_TABLET | Freq: Once | ORAL | Status: AC
  Administered 2021-03-14: 5 mg via ORAL
  Filled 2021-03-14: qty 1

## 2021-03-14 NOTE — ED Triage Notes (Signed)
Patient c/o hypertension x1 week despite taking amlodipine as prescribed. Reports N/V since this morning.

## 2021-03-14 NOTE — ED Notes (Addendum)
Per Collier Salina PA, patient does not need I-stat Beta drawn due to hysterectomy.

## 2021-03-14 NOTE — ED Provider Notes (Signed)
Emergency Medicine Provider Triage Evaluation Note  Jennifer Bean , a 45 y.o. female  was evaluated in triage.  Pt complains of hypertension, myalgia, fatigue, and left ankle pain.  Patient reports that she has been dealing with hypertension for the past week.  States that blood pressure at home has had systolic in the [id number] consistently.  Patient reports that she has been quite compliant with her 5 mg Norvasc daily.  Patient reports that she has had generalized Myalgias and fatigue since Sunday.  Patient denies any fevers, chills, URI symptoms, known sick contacts.  Patient has not been vaccinated for COVID-19.  Patient also complains of pain to left ankle.  States that she rolled her ankle and has had pain since.  Patient also reports swelling to medial aspect of left ankle.  Patient reports episode of dry heaving today.  Patient denies any abdominal pain or discomfort.  Review of Systems  Positive: Generalized myalgia, fatigue, hypertension, arthralgia Negative: Numbness, weakness, facial asymmetry, chest pain, shortness of breath, abdominal pain, nausea, vomiting, URI symptoms  Physical Exam  BP (!) 189/122   Pulse 94   Temp 98.6 F (37 C) (Oral)   Resp 18   LMP 10/26/2016 (Exact Date) Comment: hysterectomy 11/04/16  SpO2 95%  Gen:   Awake, no distress   Resp:  Normal effort, lungs clear to auscultation bilaterally MSK:   Moves extremities without difficulty, swelling to medial aspect of left ankle.  Tenderness to medial aspect of left ankle. Other:  Abdomen soft, nondistended, nontender.  Medical Decision Making  Medically screening exam initiated at 12:57 PM.  Appropriate orders placed.  Darcia R Markos was informed that the remainder of the evaluation will be completed by another provider, this initial triage assessment does not replace that evaluation, and the importance of remaining in the ED until their evaluation is complete.  The patient appears stable so that the  remainder of the work up may be completed by another provider.      Dyann Ruddle 03/14/21 1259    Hayden Rasmussen, MD 03/14/21 [phone]

## 2021-03-14 NOTE — ED Provider Notes (Addendum)
[facility] DEPT Provider Note   CSN: [id number] Arrival date & time: 03/14/21  1214     History Chief Complaint  Patient presents with   Hypertension   Emesis    Jennifer Bean is a 45 y.o. female.  45 yo female with hx as below presents to ED with multiple complaints.  Patient reports that she has been feeling somewhat rundown over the past 1 to 2 weeks.  Has been exerting herself more frequently, been very busy with family events and appointments.  Reports she injured her ankle last month did not come to the hospital for evaluation.  She been having nagging left ankle pain she feels like has been exacerbating her blood pressure over the past week.  Good compliance with antihypertensives.  No fevers, chills, nausea.  Does report some dry heaving over the past couple days.  No change to oral intake.  No change in bowel or bladder function.  bdominal discomfort.  No sick contact or recent travel. Not COVID19 vaccinated. Recent family reunion.   The history is provided by the patient. No language interpreter was used.  Hypertension Pertinent negatives include no chest pain, no abdominal pain, no headaches and no shortness of breath.  Emesis Associated symptoms: arthralgias   Associated symptoms: no abdominal pain, no chills, no cough, no fever and no headaches       Past Medical History:  Diagnosis Date   Anxiety    Diabetes mellitus without complication ([place])    Dysmenorrhea 11/04/2016   High cholesterol    Hypertension    Pyrexia of unknown origin 11/13/2016   Sleep apnea    questionable had sleep study performed twice unsuccessful, needs to reschedule   TIA (transient ischemic attack) 2014   no residual   Vitamin D deficiency     Patient Active Problem List   Diagnosis Date Noted   Pyrexia of unknown origin 11/13/2016   Postoperative fever 11/07/2016   Status post laparoscopic hysterectomy 11/04/2016    Past Surgical History:   Procedure Laterality Date   CHOLECYSTECTOMY     ROBOTIC ASSISTED TOTAL HYSTERECTOMY WITH SALPINGECTOMY Bilateral 11/04/2016   Procedure: ROBOTIC ASSISTED TOTAL HYSTERECTOMY WITH SALPINGECTOMY;  Surgeon: Azucena Fallen, MD;  Location: [facility];  Service: Gynecology;  Laterality: Bilateral;   TUBAL LIGATION       OB History   No obstetric history on file.     History reviewed. No pertinent family history.  Social History   Tobacco Use   Smoking status: Never   Smokeless tobacco: Never  Substance Use Topics   Alcohol use: Yes    Comment: social   Drug use: No    Home Medications Prior to Admission medications   Medication Sig Start Date End Date Taking? Authorizing Provider  sucralfate (CARAFATE) 1 g tablet Take 1 tablet (1 g total) by mouth 4 (four) times daily -  with meals and at bedtime for 5 days. 03/14/21 03/19/21 Yes Wynona Dove A, DO  amLODipine (NORVASC) 5 MG tablet Take 5 mg by mouth daily. 09/11/16   [provider]  atorvastatin (LIPITOR) 20 MG tablet Take 20 mg by mouth daily.    [provider]  cholecalciferol (VITAMIN D) 1000 units tablet Take 1,000 Units by mouth daily.    [provider]  gabapentin (NEURONTIN) 300 MG capsule Take 300 mg by mouth 3 (three) times daily.    [provider]  ibuprofen (ADVIL,MOTRIN) 800 MG tablet Take 400-800 mg by mouth 2 (  two) times daily as needed for moderate pain (takes 0.5-1 depending on pain level).     [provider]  metFORMIN (GLUCOPHAGE) 500 MG tablet Take 500 mg by mouth 2 (two) times daily with a meal.    [provider]  naproxen (NAPROSYN) 500 MG tablet Take 1 tablet (500 mg total) by mouth 2 (two) times daily with a meal. 03/14/17   Larene Pickett, PA-C  oxyCODONE-acetaminophen (PERCOCET/ROXICET) 5-325 MG tablet Take 1 tablet by mouth every 8 (eight) hours as needed for severe pain (moderate to severe pain (when tolerating fluids)). 11/13/16   Azucena Fallen, MD  PROAIR  HFA 108 [phone] Base) MCG/ACT inhaler INHALE 2 PUFFS 4 TIMES DAILY AS NEEDED FOR SHORTNESS OF BREATH 08/16/16   [provider]    Allergies    Latex  Review of Systems   Review of Systems  Constitutional:  Positive for fatigue. Negative for chills and fever.  HENT:  Negative for facial swelling and trouble swallowing.   Eyes:  Negative for photophobia and visual disturbance.  Respiratory:  Negative for cough and shortness of breath.   Cardiovascular:  Negative for chest pain and palpitations.  Gastrointestinal:  Positive for nausea. Negative for abdominal pain and vomiting.  Endocrine: Negative for polydipsia and polyuria.  Genitourinary:  Negative for difficulty urinating and hematuria.  Musculoskeletal:  Positive for arthralgias. Negative for gait problem and joint swelling.  Skin:  Negative for pallor and rash.  Neurological:  Negative for syncope and headaches.  Psychiatric/Behavioral:  Negative for agitation and confusion.    Physical Exam Updated Vital Signs BP (!) 185/112   Pulse 81   Temp 98.6 F (37 C) (Oral)   Resp (!) 23   LMP 10/26/2016 (Exact Date) Comment: hysterectomy 11/04/16  SpO2 100%   Physical Exam Vitals and nursing note reviewed.  Constitutional:      General: She is not in acute distress.    Appearance: Normal appearance. She is obese.  HENT:     Head: Normocephalic and atraumatic.     Right Ear: External ear normal.     Left Ear: External ear normal.     Nose: Nose normal.     Mouth/Throat:     Mouth: Mucous membranes are moist.  Eyes:     General: No scleral icterus.       Right eye: No discharge.        Left eye: No discharge.  Cardiovascular:     Rate and Rhythm: Normal rate and regular rhythm.     Pulses: Normal pulses.     Heart sounds: Normal heart sounds.  Pulmonary:     Effort: Pulmonary effort is normal. No respiratory distress.     Breath sounds: Normal breath sounds.  Abdominal:     General: Abdomen is flat.     Tenderness:  There is no abdominal tenderness.  Musculoskeletal:        General: Normal range of motion.     Cervical back: Normal range of motion.     Right lower leg: No edema.     Left lower leg: No edema.     Comments: Left ankle pain to medial malleolus and lateral mid foot. 2+ DP pulses equal b/l. Normal ROM. Minimal swelling to ankle. She is ambulatory   Skin:    General: Skin is warm and dry.     Capillary Refill: Capillary refill takes less than 2 seconds.  Neurological:     Mental Status: She is alert.  Psychiatric:        Mood and Affect: Mood normal.        Behavior: Behavior normal.    ED Results / Procedures / Treatments   Labs (all labs ordered are listed, but only abnormal results are displayed) Labs Reviewed  COMPREHENSIVE METABOLIC PANEL - Abnormal; Notable for the following components:      Result Value   Glucose, Bld 201 (*)    All other components within normal limits  RESP PANEL BY RT-PCR (FLU A&B, COVID) ARPGX2  CBC WITH DIFFERENTIAL/PLATELET  LIPASE, BLOOD    EKG None  Radiology DG Ankle Complete Left  Result Date: 03/14/2021 CLINICAL DATA:  Left ankle pain, swelling for 1 month EXAM: LEFT ANKLE COMPLETE - 3+ VIEW COMPARISON:  Ankle radiograph 03/14/2017 FINDINGS: There is no evidence of acute fracture. There is irregular, well corticated calcification along the lateral midfoot which could be from a chronic injury. There is generalized ankle soft tissue swelling. IMPRESSION: No acute osseous abnormality. Evidence of chronic injury to the lateral midfoot, possibly an extensor digitorum brevis avulsion injury. Generalized ankle soft tissue swelling. Electronically Signed   By: Maurine Simmering M.D.   On: 03/14/2021 13:51    Procedures Procedures   Medications Ordered in ED Medications  amLODipine (NORVASC) tablet 5 mg (5 mg Oral Given 03/14/21 1730)  sucralfate (CARAFATE) tablet 1 g (1 g Oral Given 03/14/21 1730)    ED Course  I have reviewed the triage vital signs  and the nursing notes.  Pertinent labs & imaging results that were available during my care of the patient were reviewed by me and considered in my medical decision making (see chart for details).    MDM Rules/Calculators/A&P                           45 year old female with history as above presented ER secondary to fatigue, ankle pain.  "Dry heaving."  No abdominal pain.  Abdomen soft, nontender, nonperitoneal.  Vital signs reviewed.  Patient is well-appearing.  Serious etiology considered  Labs reviewed and are stable.   Left ankle x-ray concern for possible chronic injury to lateral midfoot.  Placed patient in an Aircast that she has swelling and pain with palpation, ambulation.  Concern for possible ankle sprain as the injury on XR does appear more chronic. Patient follow-up with orthopedics as an outpatient.  Supportive care.  Patient about elevated blood pressure, given her home amlodipine.  Chest pain, no dyspnea. Aymptomatic hypertension  Abdomen is soft, nontender, nonperitoneal.  She has no abdominal pain.  She is tolerant p.o. intake.  No emesis.  No nausea.  States that she feels as though she is at her baseline.   The patient improved significantly and was discharged in stable condition. Detailed discussions were had with the patient regarding current findings, and need for close f/u with PCP or on call doctor. The patient has been instructed to return immediately if the symptoms worsen in any way for re-evaluation. Patient verbalized understanding and is in agreement with current care plan. All questions answered prior to discharge.    Final Clinical Impression(s) / ED Diagnoses Final diagnoses:  Hypertension, unspecified type  Injury of left ankle, initial encounter    Rx / DC Orders ED Discharge Orders          Ordered    sucralfate (CARAFATE) 1 g tablet  3 times daily with meals & bedtime  03/14/21 Taft, [place], DO 03/14/21  1809    Jeanell Sparrow, DO 03/14/21 1816

## 2021-03-19 DIAGNOSIS — E782 Mixed hyperlipidemia: Secondary | ICD-10-CM | POA: Diagnosis not present

## 2021-03-19 DIAGNOSIS — K2100 Gastro-esophageal reflux disease with esophagitis, without bleeding: Secondary | ICD-10-CM | POA: Diagnosis not present

## 2021-03-19 DIAGNOSIS — E1142 Type 2 diabetes mellitus with diabetic polyneuropathy: Secondary | ICD-10-CM | POA: Diagnosis not present

## 2021-03-19 DIAGNOSIS — S93492A Sprain of other ligament of left ankle, initial encounter: Secondary | ICD-10-CM | POA: Diagnosis not present

## 2021-03-19 DIAGNOSIS — I10 Essential (primary) hypertension: Secondary | ICD-10-CM | POA: Diagnosis not present

## 2021-03-19 DIAGNOSIS — M25562 Pain in left knee: Secondary | ICD-10-CM | POA: Diagnosis not present

## 2021-03-19 DIAGNOSIS — M25561 Pain in right knee: Secondary | ICD-10-CM | POA: Diagnosis not present

## 2021-03-19 DIAGNOSIS — E1165 Type 2 diabetes mellitus with hyperglycemia: Secondary | ICD-10-CM | POA: Diagnosis not present

## 2021-03-19 DIAGNOSIS — Z0000 Encounter for general adult medical examination without abnormal findings: Secondary | ICD-10-CM | POA: Diagnosis not present

## 2021-04-19 DIAGNOSIS — U071 COVID-19: Secondary | ICD-10-CM | POA: Diagnosis not present

## 2021-06-14 DIAGNOSIS — E1142 Type 2 diabetes mellitus with diabetic polyneuropathy: Secondary | ICD-10-CM | POA: Diagnosis not present

## 2021-06-14 DIAGNOSIS — Z0000 Encounter for general adult medical examination without abnormal findings: Secondary | ICD-10-CM | POA: Diagnosis not present

## 2021-06-14 DIAGNOSIS — E782 Mixed hyperlipidemia: Secondary | ICD-10-CM | POA: Diagnosis not present

## 2021-06-14 DIAGNOSIS — M159 Polyosteoarthritis, unspecified: Secondary | ICD-10-CM | POA: Diagnosis not present

## 2021-06-14 DIAGNOSIS — G8929 Other chronic pain: Secondary | ICD-10-CM | POA: Diagnosis not present

## 2021-06-14 DIAGNOSIS — M549 Dorsalgia, unspecified: Secondary | ICD-10-CM | POA: Diagnosis not present

## 2021-06-14 DIAGNOSIS — E1165 Type 2 diabetes mellitus with hyperglycemia: Secondary | ICD-10-CM | POA: Diagnosis not present

## 2021-06-14 DIAGNOSIS — K2100 Gastro-esophageal reflux disease with esophagitis, without bleeding: Secondary | ICD-10-CM | POA: Diagnosis not present

## 2021-06-14 DIAGNOSIS — I10 Essential (primary) hypertension: Secondary | ICD-10-CM | POA: Diagnosis not present

## 2022-03-07 ENCOUNTER — Other Ambulatory Visit: Payer: Self-pay | Admitting: Physician Assistant

## 2022-03-07 DIAGNOSIS — Z1231 Encounter for screening mammogram for malignant neoplasm of breast: Secondary | ICD-10-CM

## 2022-04-08 ENCOUNTER — Ambulatory Visit
Admission: RE | Admit: 2022-04-08 | Discharge: 2022-04-08 | Disposition: A | Discharge status: Home / Self Care | Payer: Medicare Other | Source: Ambulatory Visit | Attending: Physician Assistant | Admitting: Physician Assistant

## 2022-04-08 DIAGNOSIS — Z1231 Encounter for screening mammogram for malignant neoplasm of breast: Secondary | ICD-10-CM

## 2022-04-13 ENCOUNTER — Emergency Department (HOSPITAL_BASED_OUTPATIENT_CLINIC_OR_DEPARTMENT_OTHER): Payer: Medicare Other

## 2022-04-13 ENCOUNTER — Encounter (HOSPITAL_BASED_OUTPATIENT_CLINIC_OR_DEPARTMENT_OTHER): Payer: Self-pay

## 2022-04-13 ENCOUNTER — Emergency Department (HOSPITAL_BASED_OUTPATIENT_CLINIC_OR_DEPARTMENT_OTHER)
Admission: EM | Admit: 2022-04-13 | Discharge: 2022-04-14 | Disposition: A | Discharge status: Home / Self Care | Payer: Medicare Other | Attending: Emergency Medicine | Admitting: Emergency Medicine

## 2022-04-13 ENCOUNTER — Other Ambulatory Visit: Payer: Self-pay

## 2022-04-13 DIAGNOSIS — N760 Acute vaginitis: Secondary | ICD-10-CM | POA: Insufficient documentation

## 2022-04-13 DIAGNOSIS — B9689 Other specified bacterial agents as the cause of diseases classified elsewhere: Secondary | ICD-10-CM | POA: Diagnosis not present

## 2022-04-13 DIAGNOSIS — R1030 Lower abdominal pain, unspecified: Secondary | ICD-10-CM

## 2022-04-13 DIAGNOSIS — Z91040 Latex allergy status: Secondary | ICD-10-CM | POA: Diagnosis not present

## 2022-04-13 DIAGNOSIS — R102 Pelvic and perineal pain: Secondary | ICD-10-CM | POA: Diagnosis present

## 2022-04-13 LAB — COMPREHENSIVE METABOLIC PANEL
ALT: 31 U/L (ref 0–44)
AST: 19 U/L (ref 15–41)
Albumin: 3.6 g/dL (ref 3.5–5.0)
Alkaline Phosphatase: 63 U/L (ref 38–126)
Anion gap: 7 (ref 5–15)
BUN: 10 mg/dL (ref 6–20)
CO2: 24 mmol/L (ref 22–32)
Calcium: 8.8 mg/dL — ABNORMAL LOW (ref 8.9–10.3)
Chloride: 104 mmol/L (ref 98–111)
Creatinine, Ser: 0.76 mg/dL (ref 0.44–1.00)
GFR, Estimated: >60 mL/min (ref >60)
Glucose, Bld: 198 mg/dL — ABNORMAL HIGH (ref 70–99)
Potassium: 3.6 mmol/L (ref 3.5–5.1)
Sodium: 135 mmol/L (ref 135–145)
Total Bilirubin: 0.5 mg/dL (ref 0.3–1.2)
Total Protein: 6.8 g/dL (ref 6.5–8.1)

## 2022-04-13 LAB — CBC WITH DIFFERENTIAL/PLATELET
Abs Immature Granulocytes: 0.04 10*3/uL (ref 0.00–0.07)
Basophils Absolute: 0 10*3/uL (ref 0.0–0.1)
Basophils Relative: 0 %
Eosinophils Absolute: 0.1 10*3/uL (ref 0.0–0.5)
Eosinophils Relative: 1 %
HCT: 36 % (ref 36.0–46.0)
Hemoglobin: 11.7 g/dL — ABNORMAL LOW (ref 12.0–15.0)
Immature Granulocytes: 0 %
Lymphocytes Relative: 39 %
Lymphs Abs: 3.7 10*3/uL (ref 0.7–4.0)
MCH: 25.8 pg — ABNORMAL LOW (ref 26.0–34.0)
MCHC: 32.5 g/dL (ref 30.0–36.0)
MCV: 79.5 fL — ABNORMAL LOW (ref 80.0–100.0)
Monocytes Absolute: 0.6 10*3/uL (ref 0.1–1.0)
Monocytes Relative: 7 %
Neutro Abs: 4.9 10*3/uL (ref 1.7–7.7)
Neutrophils Relative %: 53 %
Platelets: 398 10*3/uL (ref 150–400)
RBC: 4.53 MIL/uL (ref 3.87–5.11)
RDW: 14.6 % (ref 11.5–15.5)
WBC: 9.4 10*3/uL (ref 4.0–10.5)
nRBC: 0 % (ref 0.0–0.2)

## 2022-04-13 LAB — WET PREP, GENITAL
Sperm: NONE SEEN
Trich, Wet Prep: NONE SEEN
WBC, Wet Prep HPF POC: <10 (ref <10)
Yeast Wet Prep HPF POC: NONE SEEN

## 2022-04-13 LAB — URINALYSIS, ROUTINE W REFLEX MICROSCOPIC
Bilirubin Urine: NEGATIVE
Glucose, UA: >=500 mg/dL — AB
Ketones, ur: NEGATIVE mg/dL
Leukocytes,Ua: NEGATIVE
Nitrite: NEGATIVE
Protein, ur: NEGATIVE mg/dL
Specific Gravity, Urine: 1.025 (ref 1.005–1.030)
pH: 6 (ref 5.0–8.0)

## 2022-04-13 LAB — URINALYSIS, MICROSCOPIC (REFLEX)

## 2022-04-13 LAB — LIPASE, BLOOD: Lipase: 46 U/L (ref 11–51)

## 2022-04-13 LAB — PREGNANCY, URINE: Preg Test, Ur: NEGATIVE

## 2022-04-13 NOTE — ED Provider Notes (Signed)
[facility] EMERGENCY DEPARTMENT Provider Note   CSN: [id number] Arrival date & time: 04/13/22  2000     History  Chief Complaint  Patient presents with   Vaginal Pain    Jennifer Bean is a 46 y.o. female who presents to the emergency department with concerns for vaginal discomfort onset 2 months.  Was evaluated by her primary care provider multiple times for his symptoms.  Most recently was evaluated by her primary care provider last week.  Patient notes that she has had a hysterectomy with her ovaries still in place.  No concerns for STDs at this time, patient is not sexually active.  Has associated bilateral lower abdominal pain.  Also notes that her vaginal discomfort feels like a pressure to the vaginal region.  Patient notes that she has not taken her antihypertensives tonight however has been compliant with her medications that she takes (amlodipine, losartan, metoprolol).  Denies fever, urinary symptoms, vaginal bleeding, vaginal discharge, abdominal pain, nausea, vomiting.  The history is provided by the patient. No language interpreter was used.  Vaginal Pain Pertinent negatives include no abdominal pain.       Home Medications Prior to Admission medications   Medication Sig Start Date End Date Taking? Authorizing Provider  amLODipine (NORVASC) 5 MG tablet Take 5 mg by mouth daily. 09/11/16   [provider]  atorvastatin (LIPITOR) 20 MG tablet Take 20 mg by mouth daily.    [provider]  cholecalciferol (VITAMIN D) 1000 units tablet Take 1,000 Units by mouth daily.    [provider]  gabapentin (NEURONTIN) 300 MG capsule Take 300 mg by mouth 3 (three) times daily.    [provider]  ibuprofen (ADVIL,MOTRIN) 800 MG tablet Take 400-800 mg by mouth 2 (two) times daily as needed for moderate pain (takes 0.5-1 depending on pain level).     [provider]  metFORMIN (GLUCOPHAGE) 500 MG tablet Take 500 mg by mouth 2  (two) times daily with a meal.    [provider]  naproxen (NAPROSYN) 500 MG tablet Take 1 tablet (500 mg total) by mouth 2 (two) times daily with a meal. 03/14/17   Larene Pickett, PA-C  oxyCODONE-acetaminophen (PERCOCET/ROXICET) 5-325 MG tablet Take 1 tablet by mouth every 8 (eight) hours as needed for severe pain (moderate to severe pain (when tolerating fluids)). 11/13/16   Azucena Fallen, MD  PROAIR HFA 108 [phone] Base) MCG/ACT inhaler INHALE 2 PUFFS 4 TIMES DAILY AS NEEDED FOR SHORTNESS OF BREATH 08/16/16   [provider]  sucralfate (CARAFATE) 1 g tablet Take 1 tablet (1 g total) by mouth 4 (four) times daily -  with meals and at bedtime for 5 days. 03/14/21 03/19/21  Jeanell Sparrow, DO      Allergies    Latex    Review of Systems   Review of Systems  Constitutional:  Negative for fever.  Gastrointestinal:  Negative for abdominal pain, nausea and vomiting.  Genitourinary:  Positive for vaginal pain. Negative for dysuria, hematuria, vaginal bleeding and vaginal discharge.  All other systems reviewed and are negative.   Physical Exam Updated Vital Signs BP (!) 188/117   Pulse 92   Temp 99.1 F (37.3 C) (Oral)   Resp 18   LMP 10/26/2016 (Exact Date) Comment: hysterectomy 11/04/16  SpO2 97%  Physical Exam Vitals and nursing note reviewed. Exam conducted with a chaperone present.  Constitutional:      General: She is not in acute distress.  Appearance: She is not diaphoretic.  HENT:     Head: Normocephalic and atraumatic.     Mouth/Throat:     Pharynx: No oropharyngeal exudate.  Eyes:     General: No scleral icterus.    Conjunctiva/sclera: Conjunctivae normal.  Cardiovascular:     Rate and Rhythm: Normal rate and regular rhythm.     Pulses: Normal pulses.     Heart sounds: Normal heart sounds.  Pulmonary:     Effort: Pulmonary effort is normal. No respiratory distress.     Breath sounds: Normal breath sounds. No wheezing.  Abdominal:     General: Bowel  sounds are normal.     Palpations: Abdomen is soft. There is no mass.     Tenderness: There is abdominal tenderness in the suprapubic area and left lower quadrant. There is no guarding or rebound.     Comments: Mild tenderness to palpation noted to left lower quadrant and suprapubic region  Genitourinary:    General: Normal vulva.     Labia:        Right: No rash, tenderness, lesion or injury.        Left: No rash, tenderness, lesion or injury.      Vagina: No foreign body. Vaginal discharge (white) present. No erythema or tenderness.     Adnexa:        Right: Tenderness present. No mass.         Left: Tenderness present. No mass or fullness.       Comments: RN chaperone present for GU exam. Thick white discharge noted to vaginal vault. TTP noted to left adnexa region.  Musculoskeletal:        General: Normal range of motion.     Cervical back: Normal range of motion and neck supple.  Skin:    General: Skin is warm and dry.  Neurological:     Mental Status: She is alert.  Psychiatric:        Behavior: Behavior normal.     ED Results / Procedures / Treatments   Labs (all labs ordered are listed, but only abnormal results are displayed) Labs Reviewed  PREGNANCY, URINE    EKG None  Radiology No results found.  Procedures Procedures    Medications Ordered in ED Medications - No data to display  ED Course/ Medical Decision Making/ A&P Clinical Course as of 04/13/22 2350  Sat Apr 13, 2022  2329 Clue Cells Wet Prep HPF POC(!): PRESENT [SB]    Clinical Course User Index [SB] Sunshyne Horvath A, PA-C                           Medical Decision Making Amount and/or Complexity of Data Reviewed Labs: ordered. Decision-making details documented in ED Course. Radiology: ordered.  Risk Prescription drug management.   Pt presents with concerns for vaginal discomfort onset 2 months. Has been evaluated by her primary care provider for this multiple times. Vital signs, pt  afebrile. On exam, pt with mild tenderness to palpation noted to left lower quadrant and suprapubic region.  RN chaperone present for GU exam. Thick white discharge noted to vaginal vault. TTP noted to left adnexa region. No acute cardiovascular, respiratory, abdominal exam findings. Differential diagnosis includes diverticulitis, acute cystitis, ovarian cyst, diverticulosis, colitis.   Labs:  I ordered, and personally interpreted labs.  The pertinent results include:   Lipase unremarkable CBC without leukocytosis CMP unremarkable Wet prep notable for bacterial vaginosis. Urinalysis notable for  greater than 500 glucose, trace of hemoglobin otherwise nitrate and leukocyte negative. Pregnancy urine negative  Imaging: I ordered imaging studies including CT abdomen pelvis ordered with results pending at time of sign-out   Patient case discussed with Dr. Ralene Bathe, at sign-out. Plan at sign-out is pending CT abdomen pelvis and pain control, likely Discharge home with outpatient GYN follow up, however, plans may change as per oncoming team. Patient care transferred at sign out.     This chart was dictated using voice recognition software, Dragon. Despite the best efforts of this provider to proofread and correct errors, errors may still occur which can change documentation meaning.  Final Clinical Impression(s) / ED Diagnoses Final diagnoses:  BV (bacterial vaginosis)  Lower abdominal pain    Rx / DC Orders ED Discharge Orders          Ordered    metroNIDAZOLE (FLAGYL) 500 MG tablet  2 times daily        04/14/22 0013              Pranika Finks A, PA-C 04/14/22 0013    Fredia Sorrow, MD 04/14/22 [phone]

## 2022-04-13 NOTE — ED Triage Notes (Signed)
Pt POV c/o vaginal discomfort x2 months. Denies discharge.   Also reports feeling dizzy since last night. Compliant with bp meds. Denies LOC. Denies ShOB, denies CP.

## 2022-04-14 DIAGNOSIS — N760 Acute vaginitis: Secondary | ICD-10-CM | POA: Diagnosis not present

## 2022-04-14 MED ORDER — METRONIDAZOLE 500 MG PO TABS: 500.0000 mg | ORAL_TABLET | Freq: Two times a day (BID) | ORAL | 0 refills | Status: DC

## 2022-04-14 MED ORDER — IOHEXOL 300 MG/ML  SOLN
100.0000 mL | Freq: Once | INTRAMUSCULAR | Status: AC | PRN
  Administered 2022-04-14: 100 mL via INTRAVENOUS

## 2022-04-14 MED ORDER — KETOROLAC TROMETHAMINE 15 MG/ML IJ SOLN
15.0000 mg | Freq: Once | INTRAMUSCULAR | Status: AC
  Administered 2022-04-14: 15 mg via INTRAVENOUS
  Filled 2022-04-14: qty 1

## 2022-04-14 NOTE — ED Provider Notes (Signed)
Care assumed pending CT abdomen pelvis.  Patient here for evaluation of left lower quadrant abdominal pain.  Wet prep is concerning for BV.  Patient does not have current symptoms.  Discussed with patient BV symptoms.  Will send prescription that patient can start taking if she develops symptoms.  Discussed unclear source of pain.  Discussed outpatient follow-up and return precautions.   Quintella Reichert, MD 04/14/22 [phone]

## 2022-12-27 ENCOUNTER — Emergency Department (HOSPITAL_BASED_OUTPATIENT_CLINIC_OR_DEPARTMENT_OTHER): Payer: Medicare Other

## 2022-12-27 ENCOUNTER — Other Ambulatory Visit: Payer: Self-pay

## 2022-12-27 ENCOUNTER — Encounter (HOSPITAL_BASED_OUTPATIENT_CLINIC_OR_DEPARTMENT_OTHER): Payer: Self-pay | Admitting: Emergency Medicine

## 2022-12-27 DIAGNOSIS — I10 Essential (primary) hypertension: Secondary | ICD-10-CM | POA: Diagnosis not present

## 2022-12-27 DIAGNOSIS — E119 Type 2 diabetes mellitus without complications: Secondary | ICD-10-CM | POA: Insufficient documentation

## 2022-12-27 DIAGNOSIS — Z79899 Other long term (current) drug therapy: Secondary | ICD-10-CM | POA: Insufficient documentation

## 2022-12-27 DIAGNOSIS — Z8673 Personal history of transient ischemic attack (TIA), and cerebral infarction without residual deficits: Secondary | ICD-10-CM | POA: Diagnosis not present

## 2022-12-27 DIAGNOSIS — R079 Chest pain, unspecified: Secondary | ICD-10-CM | POA: Diagnosis present

## 2022-12-27 DIAGNOSIS — R519 Headache, unspecified: Secondary | ICD-10-CM | POA: Insufficient documentation

## 2022-12-27 DIAGNOSIS — R0789 Other chest pain: Secondary | ICD-10-CM | POA: Diagnosis not present

## 2022-12-27 DIAGNOSIS — Z91040 Latex allergy status: Secondary | ICD-10-CM | POA: Diagnosis not present

## 2022-12-27 DIAGNOSIS — Z7984 Long term (current) use of oral hypoglycemic drugs: Secondary | ICD-10-CM | POA: Diagnosis not present

## 2022-12-27 DIAGNOSIS — I4510 Unspecified right bundle-branch block: Secondary | ICD-10-CM | POA: Insufficient documentation

## 2022-12-27 LAB — CBC
HCT: 40.2 % (ref 36.0–46.0)
Hemoglobin: 12.9 g/dL (ref 12.0–15.0)
MCH: 25.5 pg — ABNORMAL LOW (ref 26.0–34.0)
MCHC: 32.1 g/dL (ref 30.0–36.0)
MCV: 79.4 fL — ABNORMAL LOW (ref 80.0–100.0)
Platelets: 440 10*3/uL — ABNORMAL HIGH (ref 150–400)
RBC: 5.06 MIL/uL (ref 3.87–5.11)
RDW: 13.9 % (ref 11.5–15.5)
WBC: 9.2 10*3/uL (ref 4.0–10.5)
nRBC: 0 % (ref 0.0–0.2)

## 2022-12-27 LAB — BASIC METABOLIC PANEL
Anion gap: 9 (ref 5–15)
BUN: 9 mg/dL (ref 6–20)
CO2: 24 mmol/L (ref 22–32)
Calcium: 8.7 mg/dL — ABNORMAL LOW (ref 8.9–10.3)
Chloride: 101 mmol/L (ref 98–111)
Creatinine, Ser: 0.85 mg/dL (ref 0.44–1.00)
GFR, Estimated: >60 mL/min (ref >60)
Glucose, Bld: 104 mg/dL — ABNORMAL HIGH (ref 70–99)
Potassium: 3.3 mmol/L — ABNORMAL LOW (ref 3.5–5.1)
Sodium: 134 mmol/L — ABNORMAL LOW (ref 135–145)

## 2022-12-27 LAB — TROPONIN I (HIGH SENSITIVITY): Troponin I (High Sensitivity): 2 ng/L (ref <18)

## 2022-12-27 NOTE — ED Triage Notes (Signed)
Patient with chest pain and shortness of breath since about 10am today.  She states she has been having some salvia in her throat, she having to clear her throat a lot during the day today.  Denies nausea or vomiting.  She states she is having a headache now.

## 2022-12-28 ENCOUNTER — Emergency Department (HOSPITAL_BASED_OUTPATIENT_CLINIC_OR_DEPARTMENT_OTHER)
Admission: EM | Admit: 2022-12-28 | Discharge: 2022-12-28 | Disposition: A | Discharge status: Home / Self Care | Payer: Medicare Other | Attending: Emergency Medicine | Admitting: Emergency Medicine

## 2022-12-28 ENCOUNTER — Encounter (HOSPITAL_BASED_OUTPATIENT_CLINIC_OR_DEPARTMENT_OTHER): Payer: Self-pay | Admitting: Emergency Medicine

## 2022-12-28 DIAGNOSIS — R0789 Other chest pain: Secondary | ICD-10-CM

## 2022-12-28 HISTORY — DX: Type 2 diabetes mellitus without complications: E11.9

## 2022-12-28 LAB — TROPONIN I (HIGH SENSITIVITY): Troponin I (High Sensitivity): <2 ng/L (ref <18)

## 2022-12-28 NOTE — ED Provider Notes (Signed)
[facility] Provider Note: Lowella Dell, MD, FACEP  CSN: [id number] MRN: [id number] ARRIVAL: 12/27/22 at 2034 ROOM: MH02/MH02   CHIEF COMPLAINT  Chest Pain   HISTORY OF PRESENT ILLNESS  12/28/22 1:21 AM Jennifer Bean is a 47 y.o. female who awakened about 4 AM yesterday with a headache and bloodshot eyes.  She has also had an occasional cough throughout the day with a sensation of phlegm in her throat that needs to be coughed up.  She took vinegar yesterday as a home remedy.  About 7 PM she developed a vaguely described discomfort in her chest which she rated as a 5 out of 10.  It was associated with shortness of breath that was not severe.  While in the waiting room her headache and bloodshot eyes significantly improved and as of now she is no longer having chest pain.  She did have some palpitations while in the waiting room which she describes as her heart possibly skipping a beat or briefly beating rapidly.  She has had no nausea or vomiting.  She is having some right flank pain where she fell earlier this week.  Past Medical History:  Diagnosis Date   Anxiety    DM (diabetes mellitus) (HCC)    Dysmenorrhea 11/04/2016   High cholesterol    Hypertension    Pyrexia of unknown origin 11/13/2016   Sleep apnea    questionable had sleep study performed twice unsuccessful, needs to reschedule   TIA (transient ischemic attack) 2014   no residual   Vitamin D deficiency     Past Surgical History:  Procedure Laterality Date   CHOLECYSTECTOMY     ROBOTIC ASSISTED TOTAL HYSTERECTOMY WITH SALPINGECTOMY Bilateral 11/04/2016   Procedure: ROBOTIC ASSISTED TOTAL HYSTERECTOMY WITH SALPINGECTOMY;  Surgeon: Shea Evans, MD;  Location: [facility];  Service: Gynecology;  Laterality: Bilateral;   TUBAL LIGATION      History reviewed. No pertinent family history.  Social History   Tobacco Use   Smoking status: Never   Smokeless tobacco: Never  Substance Use Topics   Alcohol  use: Yes    Comment: social   Drug use: No    Prior to Admission medications   Medication Sig Start Date End Date Taking? Authorizing Provider  amLODipine (NORVASC) 5 MG tablet Take 5 mg by mouth daily. 09/11/16   [provider]  atorvastatin (LIPITOR) 20 MG tablet Take 20 mg by mouth daily.    [provider]  cholecalciferol (VITAMIN D) 1000 units tablet Take 1,000 Units by mouth daily.    [provider]  gabapentin (NEURONTIN) 300 MG capsule Take 300 mg by mouth 3 (three) times daily.    [provider]  ibuprofen (ADVIL,MOTRIN) 800 MG tablet Take 400-800 mg by mouth 2 (two) times daily as needed for moderate pain (takes 0.5-1 depending on pain level).     [provider]  metFORMIN (GLUCOPHAGE) 500 MG tablet Take 500 mg by mouth 2 (two) times daily with a meal.    [provider]  PROAIR HFA 108 (90 Base) MCG/ACT inhaler INHALE 2 PUFFS 4 TIMES DAILY AS NEEDED FOR SHORTNESS OF BREATH 08/16/16   [provider]  sucralfate (CARAFATE) 1 g tablet Take 1 tablet (1 g total) by mouth 4 (four) times daily -  with meals and at bedtime for 5 days. 03/14/21 03/19/21  Tanda Rockers A, DO    Allergies Latex   REVIEW OF SYSTEMS  Negative except as noted here or  in the History of Present Illness.   PHYSICAL EXAMINATION  Initial Vital Signs Blood pressure (!) 168/97, pulse 69, temperature 98 F (36.7 C), temperature source Oral, resp. rate 18, last menstrual period 10/26/2016, SpO2 99 %.  Examination General: Well-developed, well-nourished female in no acute distress; appearance consistent with age of record HENT: normocephalic; atraumatic; nasal congestion Eyes: pupils equal, round and reactive to light; extraocular muscles intact Neck: supple Heart: regular rate and rhythm Lungs: clear to auscultation bilaterally Abdomen: soft; nondistended; nontender; bowel sounds present Extremities: No deformity; full range of motion; pulses  normal Neurologic: Awake, alert and oriented; motor function intact in all extremities and symmetric; no facial droop Skin: Warm and dry Psychiatric: Normal mood and affect   RESULTS  Summary of this visit's results, reviewed and interpreted by myself:  EKG Interpretation:  Date & Time: 12/27/2022 8:42 PM  Rate: 79  Rhythm: normal sinus rhythm  QRS Axis: normal  Intervals: normal  ST/T Wave abnormalities: normal  Conduction Disutrbances:right bundle branch block  Narrative Interpretation:   Old EKG Reviewed: none available  Laboratory Studies: Results for orders placed or performed during the hospital encounter of 12/28/22 (from the past 24 hour(s))  Basic metabolic panel     Status: Abnormal   Collection Time: 12/27/22  8:50 PM  Result Value Ref Range   Sodium 134 (L) 135 - 145 mmol/L   Potassium 3.3 (L) 3.5 - 5.1 mmol/L   Chloride 101 98 - 111 mmol/L   CO2 24 22 - 32 mmol/L   Glucose, Bld 104 (H) 70 - 99 mg/dL   BUN 9 6 - 20 mg/dL   Creatinine, Ser 1.61 0.44 - 1.00 mg/dL   Calcium 8.7 (L) 8.9 - 10.3 mg/dL   GFR, Estimated >09 >60 mL/min   Anion gap 9 5 - 15  CBC     Status: Abnormal   Collection Time: 12/27/22  8:50 PM  Result Value Ref Range   WBC 9.2 4.0 - 10.5 K/uL   RBC 5.06 3.87 - 5.11 MIL/uL   Hemoglobin 12.9 12.0 - 15.0 g/dL   HCT 45.4 09.8 - 11.9 %   MCV 79.4 (L) 80.0 - 100.0 fL   MCH 25.5 (L) 26.0 - 34.0 pg   MCHC 32.1 30.0 - 36.0 g/dL   RDW 14.7 82.9 - 56.2 %   Platelets 440 (H) 150 - 400 K/uL   nRBC 0.0 0.0 - 0.2 %  Troponin I (High Sensitivity)     Status: None   Collection Time: 12/27/22  8:50 PM  Result Value Ref Range   Troponin I (High Sensitivity) 2 <18 ng/L  Troponin I (High Sensitivity)     Status: None   Collection Time: 12/28/22 12:40 AM  Result Value Ref Range   Troponin I (High Sensitivity) <2 <18 ng/L   Imaging Studies: DG Chest 2 View  Result Date: 12/27/2022 CLINICAL DATA:  Chest pain. Shortness of breath. EXAM: CHEST - 2 VIEW  COMPARISON:  Chest radiograph 11/08/2016 FINDINGS: The cardiomediastinal contours are normal. The lungs are clear. Pulmonary vasculature is normal. No consolidation, pleural effusion, or pneumothorax. No acute osseous abnormalities are seen. IMPRESSION: No acute chest findings or explanation for symptoms. Electronically Signed   By: Narda Rutherford M.D.   On: 12/27/2022 21:14    ED COURSE and MDM  Nursing notes, initial and subsequent vitals signs, including pulse oximetry, reviewed and interpreted by myself.  Vitals:   12/27/22 2045 12/28/22 0021 12/28/22 0115  BP: (!) 152/95 (!) 168/97  131/77  Pulse: 79 69 64  Resp: 18 18 (!) 23  Temp: 98.6 F (37 C) 98 F (36.7 C)   TempSrc: Oral Oral   SpO2: 99% 99% 96%   Medications - No data to display  2:40 AM Patient has had no ectopy or other arrhythmia on the monitor.  She is asymptomatic and has been chest pain-free for multiple hours.  As noted above she has normal troponins and a nonischemic EKG. HEART score 3 (low risk). Will have her follow-up with outpatient cardiology as she has never had a cardiology evaluation.  PROCEDURES  Procedures   ED DIAGNOSES     ICD-10-CM   1. Atypical chest pain  R07.89 Ambulatory referral to Cardiology         Paula Libra, MD 12/28/22 [phone]

## 2023-01-27 ENCOUNTER — Ambulatory Visit (HOSPITAL_BASED_OUTPATIENT_CLINIC_OR_DEPARTMENT_OTHER): Payer: 59 | Admitting: Cardiology

## 2023-03-04 DIAGNOSIS — E1122 Type 2 diabetes mellitus with diabetic chronic kidney disease: Secondary | ICD-10-CM | POA: Diagnosis not present

## 2023-03-18 DIAGNOSIS — E559 Vitamin D deficiency, unspecified: Secondary | ICD-10-CM | POA: Diagnosis not present

## 2023-04-01 DIAGNOSIS — E559 Vitamin D deficiency, unspecified: Secondary | ICD-10-CM | POA: Diagnosis not present

## 2023-04-01 DIAGNOSIS — E1122 Type 2 diabetes mellitus with diabetic chronic kidney disease: Secondary | ICD-10-CM | POA: Diagnosis not present

## 2023-04-18 DIAGNOSIS — E1142 Type 2 diabetes mellitus with diabetic polyneuropathy: Secondary | ICD-10-CM | POA: Diagnosis not present

## 2023-04-18 DIAGNOSIS — E782 Mixed hyperlipidemia: Secondary | ICD-10-CM | POA: Diagnosis not present

## 2023-04-18 DIAGNOSIS — E1165 Type 2 diabetes mellitus with hyperglycemia: Secondary | ICD-10-CM | POA: Diagnosis not present

## 2023-04-18 DIAGNOSIS — I10 Essential (primary) hypertension: Secondary | ICD-10-CM | POA: Diagnosis not present

## 2023-05-20 DIAGNOSIS — E559 Vitamin D deficiency, unspecified: Secondary | ICD-10-CM | POA: Diagnosis not present

## 2023-05-23 ENCOUNTER — Institutional Professional Consult (permissible substitution): Payer: 59 | Admitting: Plastic Surgery

## 2023-05-27 ENCOUNTER — Ambulatory Visit (INDEPENDENT_AMBULATORY_CARE_PROVIDER_SITE_OTHER): Payer: 59 | Admitting: Plastic Surgery

## 2023-05-27 ENCOUNTER — Other Ambulatory Visit: Payer: Self-pay | Admitting: Physician Assistant

## 2023-05-27 ENCOUNTER — Encounter: Payer: Self-pay | Admitting: Plastic Surgery

## 2023-05-27 VITALS — BP 167/92 | HR 70 | Ht 62.0 in | Wt 237.2 lb

## 2023-05-27 DIAGNOSIS — N62 Hypertrophy of breast: Secondary | ICD-10-CM | POA: Insufficient documentation

## 2023-05-27 DIAGNOSIS — M542 Cervicalgia: Secondary | ICD-10-CM

## 2023-05-27 DIAGNOSIS — G8929 Other chronic pain: Secondary | ICD-10-CM

## 2023-05-27 DIAGNOSIS — R21 Rash and other nonspecific skin eruption: Secondary | ICD-10-CM | POA: Diagnosis not present

## 2023-05-27 DIAGNOSIS — M546 Pain in thoracic spine: Secondary | ICD-10-CM

## 2023-05-27 DIAGNOSIS — M549 Dorsalgia, unspecified: Secondary | ICD-10-CM | POA: Insufficient documentation

## 2023-05-27 DIAGNOSIS — Z1231 Encounter for screening mammogram for malignant neoplasm of breast: Secondary | ICD-10-CM

## 2023-05-27 DIAGNOSIS — Z6841 Body Mass Index (BMI) 40.0 and over, adult: Secondary | ICD-10-CM

## 2023-05-27 NOTE — Progress Notes (Signed)
Patient ID: Jennifer Bean, female    DOB: [birth year], 47 y.o.   MRN: [id number]   Chief Complaint  Patient presents with   Advice Only    Mammary Hyperplasia: The patient is a 47 y.o. female with a history of mammary hyperplasia for several years.  She has extremely large breasts causing symptoms that include the following: Back pain in the upper and lower back, including neck pain. She pulls or pins her bra straps to provide better lift and relief of the pressure and pain. She notices relief by holding her breast up manually.  Her shoulder straps cause grooves and pain and pressure that requires padding for relief. Pain medication is sometimes required with motrin and tylenol.  Activities that are hindered by enlarged breasts include: exercise and running.  She has tried supportive clothing as well as fitted bras without improvement.  Her breasts are extremely large and fairly symmetric.  She has hyperpigmentation of the inframammary area on both sides.  The sternal to nipple distance on the right is 35 cm and the left is 36 cm.  The IMF distance is 20 cm.  She is 5 feet 2 inches tall and weighs 237 pounds.  The BMI = 43.3 kg/m.  Preoperative bra size = DDD/G cup.  The estimated excess breast tissue to be removed at the time of surgery = 685 grams on the left and 685 grams on the right.  Mammogram history: 2023 negative.  Family history of breast cancer:  no.  Tobacco use:  no.   The patient expresses the desire to pursue surgical intervention.  The patient is aware she will need a 2024 mammogram prior to any surgery.  She was planning on getting that scheduled before the end of the year.  The patient is taking Mounjaro and has lost 30 pounds in the last 6 months.  She has a goal of 225 pounds.  She also complains of bad odor that builds with the yeast infections under her breasts.     Review of Systems  Constitutional:  Positive for activity change. Negative for appetite change.  HENT:  Negative.    Eyes: Negative.   Respiratory: Negative.    Cardiovascular: Negative.   Gastrointestinal: Negative.   Endocrine: Negative.   Genitourinary: Negative.   Musculoskeletal:  Positive for back pain and neck pain.  Skin:  Positive for rash.  Hematological: Negative.     Past Medical History:  Diagnosis Date   Anxiety    DM (diabetes mellitus) (HCC)    Dysmenorrhea 11/04/2016   High cholesterol    Hypertension    Pyrexia of unknown origin 11/13/2016   Sleep apnea    questionable had sleep study performed twice unsuccessful, needs to reschedule   TIA (transient ischemic attack) 2014   no residual   Vitamin D deficiency     Past Surgical History:  Procedure Laterality Date   CHOLECYSTECTOMY     ROBOTIC ASSISTED TOTAL HYSTERECTOMY WITH SALPINGECTOMY Bilateral 11/04/2016   Procedure: ROBOTIC ASSISTED TOTAL HYSTERECTOMY WITH SALPINGECTOMY;  Surgeon: Shea Evans, MD;  Location: [facility];  Service: Gynecology;  Laterality: Bilateral;   TUBAL LIGATION        Current Outpatient Medications:    ALPRAZolam (XANAX) 1 MG tablet, Take by mouth., Disp: , Rfl:    amLODipine (NORVASC) 5 MG tablet, Take 5 mg by mouth daily., Disp: , Rfl: 0   atorvastatin (LIPITOR) 80 MG tablet, Take 80 mg by mouth daily., Disp: , Rfl:  cholecalciferol (VITAMIN D) 1000 units tablet, Take 1,000 Units by mouth daily., Disp: , Rfl:    DULoxetine (CYMBALTA) 60 MG capsule, Take 120 mg by mouth daily., Disp: , Rfl:    gabapentin (NEURONTIN) 300 MG capsule, Take 300 mg by mouth 3 (three) times daily., Disp: , Rfl:    ibuprofen (ADVIL,MOTRIN) 800 MG tablet, Take 400-800 mg by mouth 2 (two) times daily as needed for moderate pain (takes 0.5-1 depending on pain level). , Disp: , Rfl:    metoprolol tartrate (LOPRESSOR) 50 MG tablet, Take 50 mg by mouth 2 (two) times daily., Disp: , Rfl:    MOUNJARO 5 MG/0.5ML Pen, Inject into the skin., Disp: , Rfl:    tiZANidine (ZANAFLEX) 4 MG tablet, Take 1 tablet by mouth  every 8 (eight) hours as needed., Disp: , Rfl:    Objective:   Vitals:   05/27/23 1436  BP: (!) 167/92  Pulse: 70  SpO2: 97%    Physical Exam Vitals and nursing note reviewed.  Constitutional:      Appearance: Normal appearance.  HENT:     Head: Atraumatic.  Cardiovascular:     Rate and Rhythm: Normal rate.     Pulses: Normal pulses.  Pulmonary:     Effort: Pulmonary effort is normal.  Abdominal:     Palpations: Abdomen is soft.  Musculoskeletal:        General: No swelling, tenderness or deformity.  Skin:    General: Skin is warm.     Capillary Refill: Capillary refill takes less than 2 seconds.     Coloration: Skin is not jaundiced.     Findings: No bruising.  Neurological:     Mental Status: She is alert and oriented to person, place, and time.  Psychiatric:        Mood and Affect: Mood normal.        Behavior: Behavior normal.        Thought Content: Thought content normal.     Assessment & Plan:  Symptomatic mammary hypertrophy  Chronic bilateral thoracic back pain  The procedure the patient selected and that was best for the patient was discussed. The risk were discussed and include but not limited to the following:  Breast asymmetry, fluid accumulation, firmness of the breast, inability to breast feed, loss of nipple or areola, skin loss, change in skin and nipple sensation, fat necrosis of the breast tissue, bleeding, infection and healing delay.  There are risks of anesthesia and injury to nerves or blood vessels.  Allergic reaction to tape, suture and skin glue are possible.  There will be swelling.  Any of these can lead to the need for revisional surgery which is not included in this surgery.  A breast reduction has potential to interfere with diagnostic procedures in the future.  This procedure is best done when the breast is fully developed.  Changes in the breast will continue to occur over time: pregnancy, weight gain or weigh loss. No guarantees are given  for a certain bra or breast size.    Total time: 40 minutes. This includes time spent with the patient during the visit as well as time spent before and after the visit reviewing the chart, documenting the encounter, ordering pertinent studies and literature for the patient.   Physical therapy: Not required Mammogram: must have a 2024 mammogram prior to any surgery Patient is planning on losing another 10 or so pounds.  She thinks that probably she can achieve that goal by January.  I would like to have her come back and see [place] then.  If she is closer to her goal we can submit this to insurance for her.  Pictures were obtained of the patient and placed in the chart with the patient's or guardian's permission.   Alena Bills Donnita Farina, DO

## 2023-05-29 ENCOUNTER — Ambulatory Visit
Admission: RE | Admit: 2023-05-29 | Discharge: 2023-05-29 | Disposition: A | Discharge status: Home / Self Care | Payer: 59 | Source: Ambulatory Visit | Attending: Physician Assistant | Admitting: Physician Assistant

## 2023-05-29 DIAGNOSIS — Z1231 Encounter for screening mammogram for malignant neoplasm of breast: Secondary | ICD-10-CM

## 2023-05-30 DIAGNOSIS — E1142 Type 2 diabetes mellitus with diabetic polyneuropathy: Secondary | ICD-10-CM | POA: Diagnosis not present

## 2023-05-30 DIAGNOSIS — E1165 Type 2 diabetes mellitus with hyperglycemia: Secondary | ICD-10-CM | POA: Diagnosis not present

## 2023-05-30 DIAGNOSIS — I10 Essential (primary) hypertension: Secondary | ICD-10-CM | POA: Diagnosis not present

## 2023-05-30 DIAGNOSIS — E782 Mixed hyperlipidemia: Secondary | ICD-10-CM | POA: Diagnosis not present

## 2023-06-19 DIAGNOSIS — E559 Vitamin D deficiency, unspecified: Secondary | ICD-10-CM | POA: Diagnosis not present

## 2023-06-19 DIAGNOSIS — E1122 Type 2 diabetes mellitus with diabetic chronic kidney disease: Secondary | ICD-10-CM | POA: Diagnosis not present

## 2023-07-03 DIAGNOSIS — E559 Vitamin D deficiency, unspecified: Secondary | ICD-10-CM | POA: Diagnosis not present

## 2023-08-10 IMAGING — CR DG ANKLE COMPLETE 3+V*L*
3 series · 3 of 3 positions shown · non-contrast
Comparison: Ankle radiograph 03/14/2017

CLINICAL DATA: Left ankle pain, swelling for 1 month

EXAM:
LEFT ANKLE COMPLETE - 3+ VIEW

[x ankle ap left]
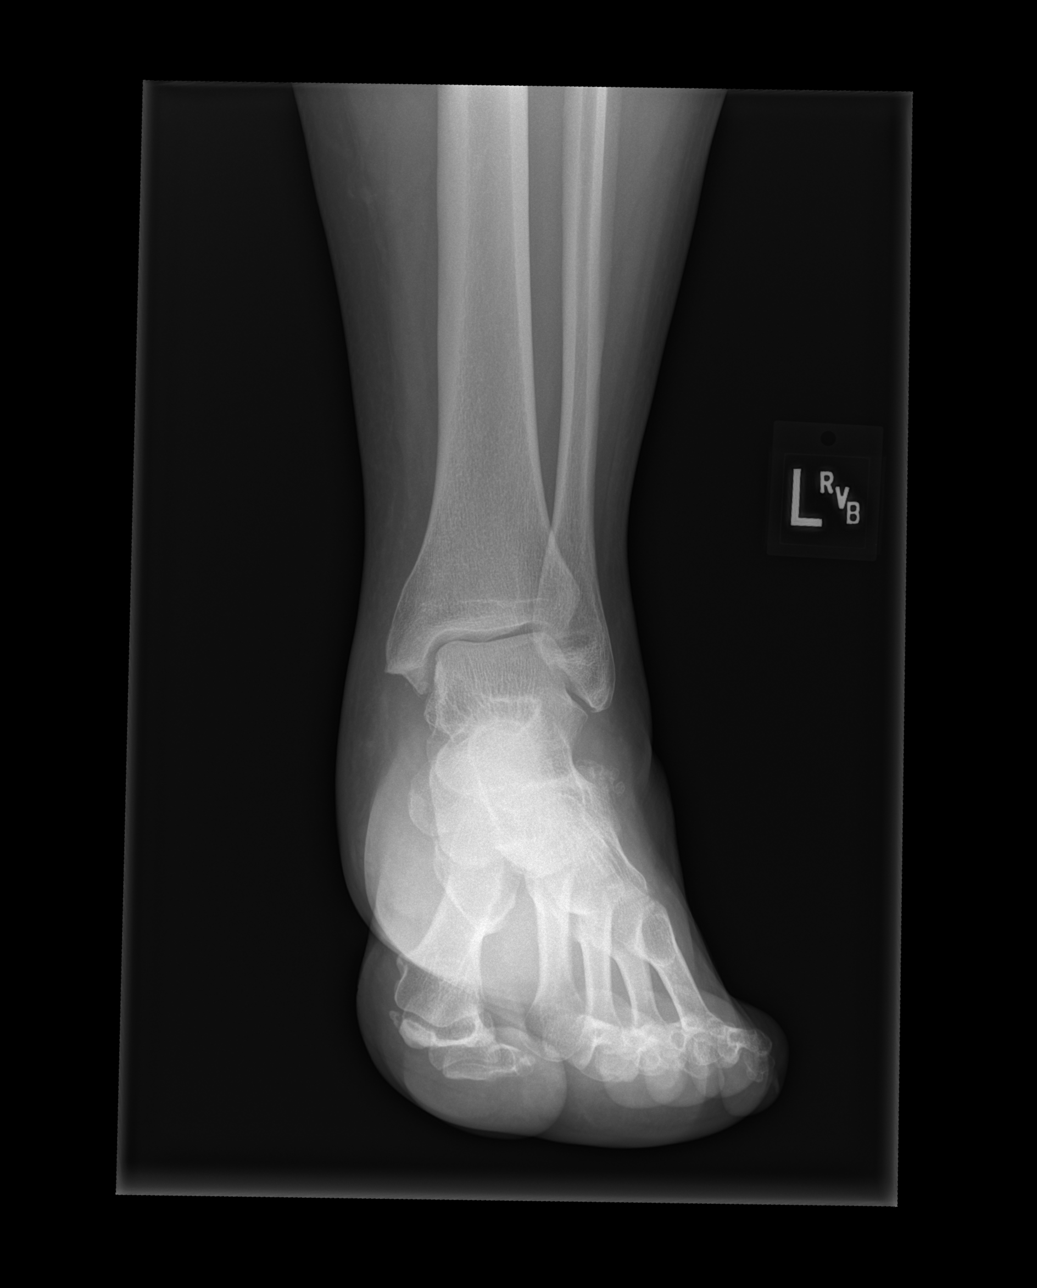

[x ankle obl left]
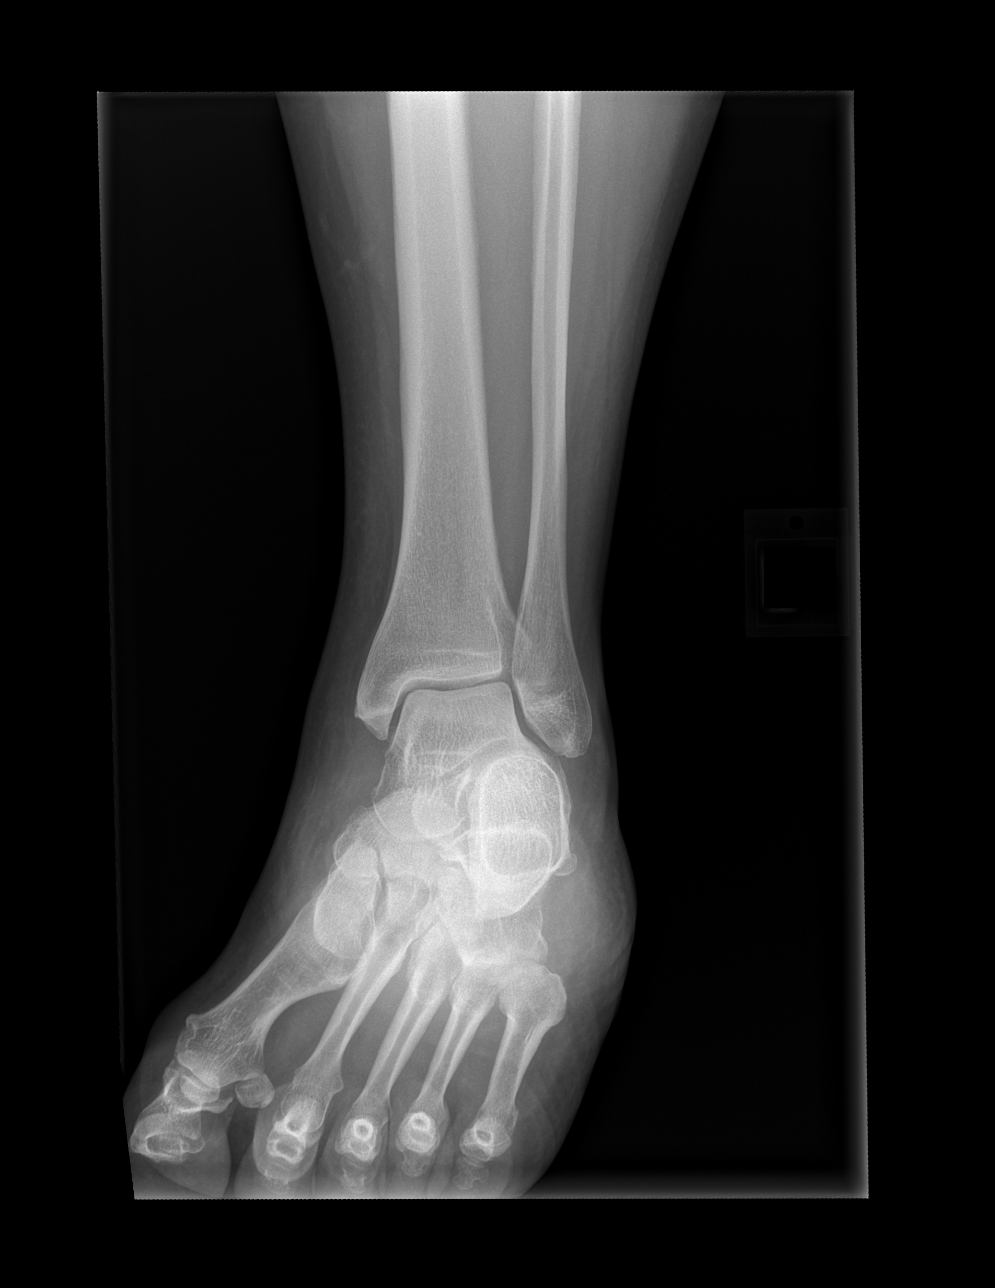

[x ankle lat left]
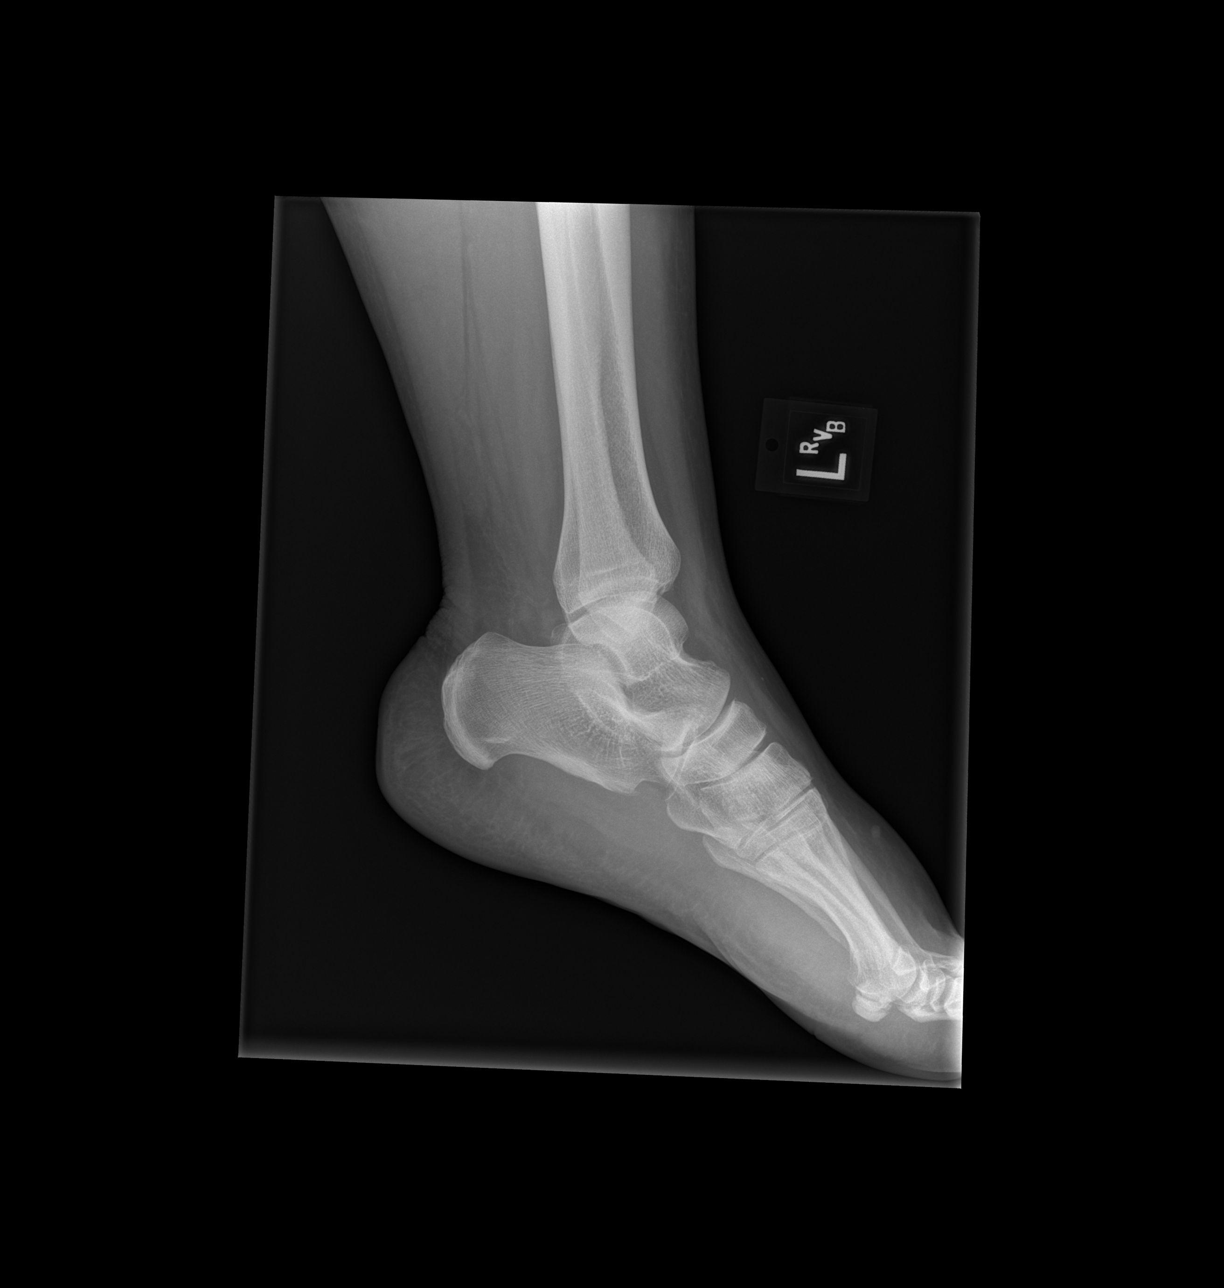

[3 of 3 positions shown; findings below may reference images not displayed]

FINDINGS: There is no evidence of acute fracture. There is irregular, well
corticated calcification along the lateral midfoot which could be
from a chronic injury. There is generalized ankle soft tissue
swelling.
IMPRESSION: No acute osseous abnormality.

Evidence of chronic injury to the lateral midfoot, possibly an
extensor digitorum brevis avulsion injury.

Generalized ankle soft tissue swelling.

## 2023-08-28 DIAGNOSIS — I10 Essential (primary) hypertension: Secondary | ICD-10-CM | POA: Diagnosis not present

## 2023-08-28 DIAGNOSIS — E1122 Type 2 diabetes mellitus with diabetic chronic kidney disease: Secondary | ICD-10-CM | POA: Diagnosis not present

## 2023-09-01 DIAGNOSIS — E1165 Type 2 diabetes mellitus with hyperglycemia: Secondary | ICD-10-CM | POA: Diagnosis not present

## 2023-09-01 DIAGNOSIS — E1142 Type 2 diabetes mellitus with diabetic polyneuropathy: Secondary | ICD-10-CM | POA: Diagnosis not present

## 2023-09-01 DIAGNOSIS — I10 Essential (primary) hypertension: Secondary | ICD-10-CM | POA: Diagnosis not present

## 2023-09-01 DIAGNOSIS — E782 Mixed hyperlipidemia: Secondary | ICD-10-CM | POA: Diagnosis not present

## 2023-09-24 DIAGNOSIS — E7800 Pure hypercholesterolemia, unspecified: Secondary | ICD-10-CM | POA: Diagnosis not present

## 2023-09-30 ENCOUNTER — Ambulatory Visit (INDEPENDENT_AMBULATORY_CARE_PROVIDER_SITE_OTHER): Payer: 59 | Admitting: Student

## 2023-09-30 ENCOUNTER — Encounter: Payer: Self-pay | Admitting: Student

## 2023-09-30 VITALS — BP 147/88 | HR 79 | Ht 62.0 in | Wt 238.0 lb

## 2023-09-30 DIAGNOSIS — N62 Hypertrophy of breast: Secondary | ICD-10-CM

## 2023-09-30 NOTE — Progress Notes (Signed)
   Referring Provider Norm Salt, PA [address] [place],  [place] [address]   CC:  Chief Complaint  Patient presents with   Follow-up      Jennifer Bean is an 48 y.o. female.  HPI: Patient is a 48 year old female with history of mammary hyperplasia.  She presents to the clinic today for further follow-up.  Patient was seen in the clinic by Dr. Ulice Bold on 05/27/2023.  At this visit, patient complained of upper back and neck pain due to her enlarged breasts.  On exam, her STN on the right was 35 cm and her STN on the left was 36 cm.  Her BMI was 43.3 kg/m and her weight was 237 pounds.  The patient reported that she has been taking Mounjaro and had lost 30 pounds in the last 6 months.  Patient had a goal weight of 225 pounds.  Patient was hoping to lose another 10 pounds or so by January.  Plan was for her to come back and see [place] when she is closer to her goal weight to then submit to insurance.  Per chart review, patient had mammogram completed on 05/29/2023.  It was BI-RADS Category 1 negative.  Today, patient reports she is doing well.  She states that although she has been working closely with a provider at [facility], her weight loss has plateaued.  She states that she has been taking Ozempic and has been adjusting her eating habits, but reports that she has had difficulty recently losing weight.  She states that although she did want to get to her goal weight of 225 pounds, she would like to proceed with breast reduction surgery.  She states that her back pain and neck pain is really bothering her and is affecting her posture.  She denies any other recent changes in her health.  Review of Systems General: Denies any recent changes in health  Physical Exam    09/30/2023    9:31 AM 09/30/2023    9:25 AM 05/27/2023    2:36 PM  Vitals with BMI  Height  5\' 2"  5\' 2"   Weight  238 lbs 237 lbs 3 oz  BMI  43.52 43.37  Systolic 147 157 604  Diastolic 88 95 92   Pulse 79 77 70    General:  No acute distress,  Alert and oriented, Non-Toxic, Normal speech and affect MSK: Ambulatory Respiratory: Unlabored breathing, no respiratory distress Psych: Normal mood, normal behavior  Assessment/Plan  Symptomatic mammary hypertrophy   Patient is interested in pursuing surgical intervention for bilateral breast reduction.  I encouraged the patient to continue to try and lose weight from now until surgery.  I discussed with her that an increased BMI can put her at risk of complications in the perioperative period.  Patient expressed understanding and still wished to proceed.  Discussed with patient we would submit to insurance for authorization, discussed approval could take up to 6 weeks.

## 2023-09-30 NOTE — H&P (View-Only) (Signed)
   Referring Provider Jennifer Salt, PA [address] [place],  [place] [address]   CC:  Chief Complaint  Patient presents with   Follow-up      Jennifer Bean is an 48 y.o. Bean.  HPI: Patient is a 48 year old Bean with history of mammary hyperplasia.  She presents to the clinic today for further follow-up.  Patient was seen in the clinic by Dr. Ulice Bold on 05/27/2023.  At this visit, patient complained of upper back and neck pain due to her enlarged breasts.  On exam, her STN on the right was 35 cm and her STN on the left was 36 cm.  Her BMI was 43.3 kg/m and her weight was 237 pounds.  The patient reported that she has been taking Mounjaro and had lost 30 pounds in the last 6 months.  Patient had a goal weight of 225 pounds.  Patient was hoping to lose another 10 pounds or so by January.  Plan was for her to come back and see [place] when she is closer to her goal weight to then submit to insurance.  Per chart review, patient had mammogram completed on 05/29/2023.  It was BI-RADS Category 1 negative.  Today, patient reports she is doing well.  She states that although she has been working closely with a provider at [facility], her weight loss has plateaued.  She states that she has been taking Ozempic and has been adjusting her eating habits, but reports that she has had difficulty recently losing weight.  She states that although she did want to get to her goal weight of 225 pounds, she would like to proceed with breast reduction surgery.  She states that her back pain and neck pain is really bothering her and is affecting her posture.  She denies any other recent changes in her health.  Review of Systems General: Denies any recent changes in health  Physical Exam    09/30/2023    9:31 AM 09/30/2023    9:25 AM 05/27/2023    2:36 PM  Vitals with BMI  Height  5\' 2"  5\' 2"   Weight  238 lbs 237 lbs 3 oz  BMI  43.52 43.37  Systolic 147 157 604  Diastolic 88 95 92   Pulse 79 77 70    General:  No acute distress,  Alert and oriented, Non-Toxic, Normal speech and affect MSK: Ambulatory Respiratory: Unlabored breathing, no respiratory distress Psych: Normal mood, normal behavior  Assessment/Plan  Symptomatic mammary hypertrophy   Patient is interested in pursuing surgical intervention for bilateral breast reduction.  I encouraged the patient to continue to try and lose weight from now until surgery.  I discussed with her that an increased BMI can put her at risk of complications in the perioperative period.  Patient expressed understanding and still wished to proceed.  Discussed with patient we would submit to insurance for authorization, discussed approval could take up to 6 weeks.

## 2023-10-14 ENCOUNTER — Encounter: Payer: Self-pay | Admitting: Student

## 2023-10-14 ENCOUNTER — Ambulatory Visit: Admitting: Student

## 2023-10-14 VITALS — BP 171/112 | HR 75 | Ht 62.0 in | Wt 234.0 lb

## 2023-10-14 DIAGNOSIS — N62 Hypertrophy of breast: Secondary | ICD-10-CM

## 2023-10-14 MED ORDER — OXYCODONE HCL 5 MG PO TABS: 5.0000 mg | ORAL_TABLET | Freq: Four times a day (QID) | ORAL | 0 refills | Status: AC | PRN

## 2023-10-14 MED ORDER — ONDANSETRON HCL 4 MG PO TABS: 4.0000 mg | ORAL_TABLET | Freq: Three times a day (TID) | ORAL | 0 refills | Status: AC | PRN

## 2023-10-14 MED ORDER — CEPHALEXIN 500 MG PO CAPS: 500.0000 mg | ORAL_CAPSULE | Freq: Four times a day (QID) | ORAL | 0 refills | Status: AC

## 2023-10-14 NOTE — Progress Notes (Signed)
 Patient ID: Jennifer Bean, female    DOB: 03/20/1976, 48 y.o.   MRN: [id number]  Chief Complaint  Patient presents with   Pre-op Exam      ICD-10-CM   1. Symptomatic mammary hypertrophy  N62        History of Present Illness: Jennifer Bean is a 48 y.o.  female  with a history of macromastia.  She presents for preoperative evaluation for upcoming procedure, Bilateral Breast Reduction with liposuction, scheduled for 10/23/2023 with Dr.  Ulice Bold  The patient has not had problems with anesthesia.  Patient denies any personal or family history of breast cancer.  She denies any history of cardiac disease.  She denies taking any blood thinners.  Patient reports she is not a smoker.  Patient denies taking any birth control or hormone replacement.  She denies any history of miscarriages.  She denies any personal family history of blood clots or clotting diseases.  She denies any recent traumas, surgeries, infections.  Patient denies any history of heart attack.  She states that she had a TIA in 2013 and denies any issues since then.  Patient denies any history of Crohn's disease or ulcerative colitis.  She denies any history of COPD or asthma.  She denies any history of cancer.  She denies any varicosities to her lower extremities.  She denies any recent fevers, chills or changes in her health.  Summary of Previous Visit: Patient was seen for initial consult by Dr. Ulice Bold on 05/27/2023.  At this visit, patient reported back and neck pain due to her enlarged breasts.  On exam, her STN was 35 cm on the right and 36 cm on the left.  Her preoperative bra size was a DDD/G cup.  The patient expressed the desire to pursue surgical intervention.  It was recommended that patient work on weight loss and return to our clinic for follow-up.  Patient was then seen again in the clinic on 09/30/2023.  At this visit, patient had weighed 238 pounds, but stated that she was having difficulty losing weight and  wanted to move forward with breast reduction.  Estimated excess breast tissue to be removed at time of surgery: 685 grams  Job: Does not work at this time  PMH Significant for: Macromastia, MDD, GAD  Patient's blood pressure is elevated in clinic today.  She states that this tends to happen when she is at a providers office.  She denies any headaches, changes in her vision, chest pain or shortness of breath.  She states that when she is at home and takes her blood pressure, her blood pressure is usually 125-120/60 something.  Patient also reports that she used to use a CPAP several years ago, but was told that she could discontinue it several years ago.   Past Medical History: Allergies: Allergies  Allergen Reactions   Latex Swelling    Irritation Family history     Current Medications:  Current Outpatient Medications:    ALPRAZolam (XANAX) 1 MG tablet, Take by mouth., Disp: , Rfl:    amLODipine (NORVASC) 5 MG tablet, Take 5 mg by mouth daily., Disp: , Rfl: 0   atorvastatin (LIPITOR) 80 MG tablet, Take 80 mg by mouth daily., Disp: , Rfl:    cholecalciferol (VITAMIN D) 1000 units tablet, Take 1,000 Units by mouth daily., Disp: , Rfl:    DULoxetine (CYMBALTA) 60 MG capsule, Take 120 mg by mouth daily., Disp: , Rfl:    gabapentin (NEURONTIN) 300  MG capsule, Take 300 mg by mouth 3 (three) times daily., Disp: , Rfl:    ibuprofen (ADVIL,MOTRIN) 800 MG tablet, Take 400-800 mg by mouth 2 (two) times daily as needed for moderate pain (takes 0.5-1 depending on pain level). , Disp: , Rfl:    metoprolol tartrate (LOPRESSOR) 50 MG tablet, Take 50 mg by mouth 2 (two) times daily., Disp: , Rfl:    MOUNJARO 5 MG/0.5ML Pen, Inject into the skin., Disp: , Rfl:    tiZANidine (ZANAFLEX) 4 MG tablet, Take 1 tablet by mouth every 8 (eight) hours as needed., Disp: , Rfl:   Past Medical Problems: Past Medical History:  Diagnosis Date   Anxiety    DM (diabetes mellitus) (HCC)    Dysmenorrhea  11/04/2016   High cholesterol    Hypertension    Pyrexia of unknown origin 11/13/2016   Sleep apnea    questionable had sleep study performed twice unsuccessful, needs to reschedule   TIA (transient ischemic attack) 2014   no residual   Vitamin D deficiency     Past Surgical History: Past Surgical History:  Procedure Laterality Date   CHOLECYSTECTOMY     ROBOTIC ASSISTED TOTAL HYSTERECTOMY WITH SALPINGECTOMY Bilateral 11/04/2016   Procedure: ROBOTIC ASSISTED TOTAL HYSTERECTOMY WITH SALPINGECTOMY;  Surgeon: Shea Evans, MD;  Location: [facility];  Service: Gynecology;  Laterality: Bilateral;   TUBAL LIGATION      Social History: Social History   Socioeconomic History   Marital status: Single    Spouse name: Not on file   Number of children: Not on file   Years of education: Not on file   Highest education level: Not on file  Occupational History   Not on file  Tobacco Use   Smoking status: Never   Smokeless tobacco: Never  Substance and Sexual Activity   Alcohol use: Yes    Comment: social   Drug use: No   Sexual activity: Yes    Birth control/protection: Surgical  Other Topics Concern   Not on file  Social History Narrative   Not on file   Social Drivers of Health   Financial Resource Strain: Not on file  Food Insecurity: Not on file  Transportation Needs: Not on file  Physical Activity: Not on file  Stress: Not on file  Social Connections: Unknown (12/09/2021)   Received from [facility]   Social Network    Social Network: Not on file  Intimate Partner Violence: Unknown (10/31/2021)   Received from [facility]   HITS    Physically Hurt: Not on file    Insult or Talk Down To: Not on file    Threaten Physical Harm: Not on file    Scream or Curse: Not on file    Family History: No family history on file.  Review of Systems: Denies any fevers or chills.  Physical Exam: Vital Signs BP (!) 171/112 (BP Location: Left Arm,  Patient Position: Sitting, Cuff Size: Large)   Pulse 75   Ht 5\' 2"  (1.575 m)   Wt 234 lb (106.1 kg)   LMP 10/26/2016 (Exact Date) Comment: hysterectomy 11/04/16  SpO2 98%   BMI 42.80 kg/m   Physical Exam  Constitutional:      General: Not in acute distress.    Appearance: Normal appearance. Not ill-appearing.  HENT:     Head: Normocephalic and atraumatic.  Neck:     Musculoskeletal: Normal range of motion.  Cardiovascular:     Rate and Rhythm: Normal  rate Pulmonary:     Effort: Pulmonary effort is normal. No respiratory distress.  Musculoskeletal: Normal range of motion.  Skin:    General: Skin is warm and dry.     Findings: No erythema or rash.  Neurological:      Mental Status: Alert and oriented to person, place, and time. Mental status is at baseline.  Psychiatric:        Mood and Affect: Mood normal.        Behavior: Behavior normal.    Assessment/Plan: The patient is scheduled for bilateral breast reduction with Dr. Ulice Bold.  Risks, benefits, and alternatives of procedure discussed, questions answered and consent obtained.    Smoking Status: Non-smoker; Counseling Given?  N/A Last Mammogram: October 2024; Results: BI-RADS Category 1 negative  Discussed with patient that she should reach out to her primary care provider in regards to her blood pressure as soon as possible.  She states that she already has an appointment booked with her PCP for today the later in the day.  I will also send a clearance to patient's PCP.  Caprini Score: 5; Risk Factors include: Age, BMI > 40, and length of planned surgery. Recommendation for mechanical pharmacological prophylaxis. Encourage early ambulation.   Pictures obtained: @consult   Post-op Rx sent to pharmacy: Oxycodone, Zofran, Keflex  Instructed patient to hold her Xanax and gabapentin the morning of surgery.  Discussed with her that she should not take oxycodone at the same time as Xanax, Zanaflex, and gabapentin as these  can be sedating.  Patient expressed understanding.  Instructed patient to hold ibuprofen, Mounjaro and any multivitamins or supplements at least 1 week prior to surgery.  Patient expressed understanding.  Patient was provided with the breast reduction and General Surgical Risk consent document and Pain Medication Agreement prior to their appointment.  They had adequate time to read through the risk consent documents and Pain Medication Agreement. We also discussed them in person together during this preop appointment. All of their questions were answered to their satisfaction.  Recommended calling if they have any further questions.  Risk consent form and Pain Medication Agreement to be scanned into patient's chart.  The risk that can be encountered with breast reduction were discussed and include the following but not limited to these:  Breast asymmetry, fluid accumulation, firmness of the breast, inability to breast feed, loss of nipple or areola, skin loss, decrease or no nipple sensation, fat necrosis of the breast tissue, bleeding, infection, healing delay.  There are risks of anesthesia, changes to skin sensation and injury to nerves or blood vessels.  The muscle can be temporarily or permanently injured.  You may have an allergic reaction to tape, suture, glue, blood products which can result in skin discoloration, swelling, pain, skin lesions, poor healing.  Any of these can lead to the need for revisonal surgery or stage procedures.  A reduction has potential to interfere with diagnostic procedures.  Nipple or breast piercing can increase risks of infection.  This procedure is best done when the breast is fully developed.  Changes in the breast will continue to occur over time.  Pregnancy can alter the outcomes of previous breast reduction surgery, weight gain and weigh loss can also effect the long term appearance.     Electronically signed by: Laurena Spies, PA-C 10/14/2023 2:27 PM

## 2023-10-14 NOTE — H&P (View-Only) (Signed)
 Patient ID: Jennifer Bean, female    DOB: 03/20/1976, 48 y.o.   MRN: [id number]  Chief Complaint  Patient presents with   Pre-op Exam      ICD-10-CM   1. Symptomatic mammary hypertrophy  N62        History of Present Illness: Jennifer Bean is a 48 y.o.  female  with a history of macromastia.  She presents for preoperative evaluation for upcoming procedure, Bilateral Breast Reduction with liposuction, scheduled for 10/23/2023 with Dr.  Ulice Bold  The patient has not had problems with anesthesia.  Patient denies any personal or family history of breast cancer.  She denies any history of cardiac disease.  She denies taking any blood thinners.  Patient reports she is not a smoker.  Patient denies taking any birth control or hormone replacement.  She denies any history of miscarriages.  She denies any personal family history of blood clots or clotting diseases.  She denies any recent traumas, surgeries, infections.  Patient denies any history of heart attack.  She states that she had a TIA in 2013 and denies any issues since then.  Patient denies any history of Crohn's disease or ulcerative colitis.  She denies any history of COPD or asthma.  She denies any history of cancer.  She denies any varicosities to her lower extremities.  She denies any recent fevers, chills or changes in her health.  Summary of Previous Visit: Patient was seen for initial consult by Dr. Ulice Bold on 05/27/2023.  At this visit, patient reported back and neck pain due to her enlarged breasts.  On exam, her STN was 35 cm on the right and 36 cm on the left.  Her preoperative bra size was a DDD/G cup.  The patient expressed the desire to pursue surgical intervention.  It was recommended that patient work on weight loss and return to our clinic for follow-up.  Patient was then seen again in the clinic on 09/30/2023.  At this visit, patient had weighed 238 pounds, but stated that she was having difficulty losing weight and  wanted to move forward with breast reduction.  Estimated excess breast tissue to be removed at time of surgery: 685 grams  Job: Does not work at this time  PMH Significant for: Macromastia, MDD, GAD  Patient's blood pressure is elevated in clinic today.  She states that this tends to happen when she is at a providers office.  She denies any headaches, changes in her vision, chest pain or shortness of breath.  She states that when she is at home and takes her blood pressure, her blood pressure is usually 125-120/60 something.  Patient also reports that she used to use a CPAP several years ago, but was told that she could discontinue it several years ago.   Past Medical History: Allergies: Allergies  Allergen Reactions   Latex Swelling    Irritation Family history     Current Medications:  Current Outpatient Medications:    ALPRAZolam (XANAX) 1 MG tablet, Take by mouth., Disp: , Rfl:    amLODipine (NORVASC) 5 MG tablet, Take 5 mg by mouth daily., Disp: , Rfl: 0   atorvastatin (LIPITOR) 80 MG tablet, Take 80 mg by mouth daily., Disp: , Rfl:    cholecalciferol (VITAMIN D) 1000 units tablet, Take 1,000 Units by mouth daily., Disp: , Rfl:    DULoxetine (CYMBALTA) 60 MG capsule, Take 120 mg by mouth daily., Disp: , Rfl:    gabapentin (NEURONTIN) 300  MG capsule, Take 300 mg by mouth 3 (three) times daily., Disp: , Rfl:    ibuprofen (ADVIL,MOTRIN) 800 MG tablet, Take 400-800 mg by mouth 2 (two) times daily as needed for moderate pain (takes 0.5-1 depending on pain level). , Disp: , Rfl:    metoprolol tartrate (LOPRESSOR) 50 MG tablet, Take 50 mg by mouth 2 (two) times daily., Disp: , Rfl:    MOUNJARO 5 MG/0.5ML Pen, Inject into the skin., Disp: , Rfl:    tiZANidine (ZANAFLEX) 4 MG tablet, Take 1 tablet by mouth every 8 (eight) hours as needed., Disp: , Rfl:   Past Medical Problems: Past Medical History:  Diagnosis Date   Anxiety    DM (diabetes mellitus) (HCC)    Dysmenorrhea  11/04/2016   High cholesterol    Hypertension    Pyrexia of unknown origin 11/13/2016   Sleep apnea    questionable had sleep study performed twice unsuccessful, needs to reschedule   TIA (transient ischemic attack) 2014   no residual   Vitamin D deficiency     Past Surgical History: Past Surgical History:  Procedure Laterality Date   CHOLECYSTECTOMY     ROBOTIC ASSISTED TOTAL HYSTERECTOMY WITH SALPINGECTOMY Bilateral 11/04/2016   Procedure: ROBOTIC ASSISTED TOTAL HYSTERECTOMY WITH SALPINGECTOMY;  Surgeon: Shea Evans, MD;  Location: [facility];  Service: Gynecology;  Laterality: Bilateral;   TUBAL LIGATION      Social History: Social History   Socioeconomic History   Marital status: Single    Spouse name: Not on file   Number of children: Not on file   Years of education: Not on file   Highest education level: Not on file  Occupational History   Not on file  Tobacco Use   Smoking status: Never   Smokeless tobacco: Never  Substance and Sexual Activity   Alcohol use: Yes    Comment: social   Drug use: No   Sexual activity: Yes    Birth control/protection: Surgical  Other Topics Concern   Not on file  Social History Narrative   Not on file   Social Drivers of Health   Financial Resource Strain: Not on file  Food Insecurity: Not on file  Transportation Needs: Not on file  Physical Activity: Not on file  Stress: Not on file  Social Connections: Unknown (12/09/2021)   Received from [facility]   Social Network    Social Network: Not on file  Intimate Partner Violence: Unknown (10/31/2021)   Received from [facility]   HITS    Physically Hurt: Not on file    Insult or Talk Down To: Not on file    Threaten Physical Harm: Not on file    Scream or Curse: Not on file    Family History: No family history on file.  Review of Systems: Denies any fevers or chills.  Physical Exam: Vital Signs BP (!) 171/112 (BP Location: Left Arm,  Patient Position: Sitting, Cuff Size: Large)   Pulse 75   Ht 5\' 2"  (1.575 m)   Wt 234 lb (106.1 kg)   LMP 10/26/2016 (Exact Date) Comment: hysterectomy 11/04/16  SpO2 98%   BMI 42.80 kg/m   Physical Exam  Constitutional:      General: Not in acute distress.    Appearance: Normal appearance. Not ill-appearing.  HENT:     Head: Normocephalic and atraumatic.  Neck:     Musculoskeletal: Normal range of motion.  Cardiovascular:     Rate and Rhythm: Normal  rate Pulmonary:     Effort: Pulmonary effort is normal. No respiratory distress.  Musculoskeletal: Normal range of motion.  Skin:    General: Skin is warm and dry.     Findings: No erythema or rash.  Neurological:      Mental Status: Alert and oriented to person, place, and time. Mental status is at baseline.  Psychiatric:        Mood and Affect: Mood normal.        Behavior: Behavior normal.    Assessment/Plan: The patient is scheduled for bilateral breast reduction with Dr. Ulice Bold.  Risks, benefits, and alternatives of procedure discussed, questions answered and consent obtained.    Smoking Status: Non-smoker; Counseling Given?  N/A Last Mammogram: October 2024; Results: BI-RADS Category 1 negative  Discussed with patient that she should reach out to her primary care provider in regards to her blood pressure as soon as possible.  She states that she already has an appointment booked with her PCP for today the later in the day.  I will also send a clearance to patient's PCP.  Caprini Score: 5; Risk Factors include: Age, BMI > 40, and length of planned surgery. Recommendation for mechanical pharmacological prophylaxis. Encourage early ambulation.   Pictures obtained: @consult   Post-op Rx sent to pharmacy: Oxycodone, Zofran, Keflex  Instructed patient to hold her Xanax and gabapentin the morning of surgery.  Discussed with her that she should not take oxycodone at the same time as Xanax, Zanaflex, and gabapentin as these  can be sedating.  Patient expressed understanding.  Instructed patient to hold ibuprofen, Mounjaro and any multivitamins or supplements at least 1 week prior to surgery.  Patient expressed understanding.  Patient was provided with the breast reduction and General Surgical Risk consent document and Pain Medication Agreement prior to their appointment.  They had adequate time to read through the risk consent documents and Pain Medication Agreement. We also discussed them in person together during this preop appointment. All of their questions were answered to their satisfaction.  Recommended calling if they have any further questions.  Risk consent form and Pain Medication Agreement to be scanned into patient's chart.  The risk that can be encountered with breast reduction were discussed and include the following but not limited to these:  Breast asymmetry, fluid accumulation, firmness of the breast, inability to breast feed, loss of nipple or areola, skin loss, decrease or no nipple sensation, fat necrosis of the breast tissue, bleeding, infection, healing delay.  There are risks of anesthesia, changes to skin sensation and injury to nerves or blood vessels.  The muscle can be temporarily or permanently injured.  You may have an allergic reaction to tape, suture, glue, blood products which can result in skin discoloration, swelling, pain, skin lesions, poor healing.  Any of these can lead to the need for revisonal surgery or stage procedures.  A reduction has potential to interfere with diagnostic procedures.  Nipple or breast piercing can increase risks of infection.  This procedure is best done when the breast is fully developed.  Changes in the breast will continue to occur over time.  Pregnancy can alter the outcomes of previous breast reduction surgery, weight gain and weigh loss can also effect the long term appearance.     Electronically signed by: Laurena Spies, PA-C 10/14/2023 2:27 PM

## 2023-10-15 DIAGNOSIS — I10 Essential (primary) hypertension: Secondary | ICD-10-CM | POA: Diagnosis not present

## 2023-10-15 DIAGNOSIS — E1165 Type 2 diabetes mellitus with hyperglycemia: Secondary | ICD-10-CM | POA: Diagnosis not present

## 2023-10-15 DIAGNOSIS — E1142 Type 2 diabetes mellitus with diabetic polyneuropathy: Secondary | ICD-10-CM | POA: Diagnosis not present

## 2023-10-15 DIAGNOSIS — E782 Mixed hyperlipidemia: Secondary | ICD-10-CM | POA: Diagnosis not present

## 2023-10-16 ENCOUNTER — Other Ambulatory Visit: Payer: Self-pay

## 2023-10-16 ENCOUNTER — Encounter (HOSPITAL_BASED_OUTPATIENT_CLINIC_OR_DEPARTMENT_OTHER): Payer: Self-pay | Admitting: Plastic Surgery

## 2023-10-16 ENCOUNTER — Telehealth: Payer: Self-pay | Admitting: Student

## 2023-10-16 DIAGNOSIS — I10 Essential (primary) hypertension: Secondary | ICD-10-CM | POA: Diagnosis not present

## 2023-10-16 DIAGNOSIS — E782 Mixed hyperlipidemia: Secondary | ICD-10-CM | POA: Diagnosis not present

## 2023-10-16 DIAGNOSIS — E1142 Type 2 diabetes mellitus with diabetic polyneuropathy: Secondary | ICD-10-CM | POA: Diagnosis not present

## 2023-10-16 DIAGNOSIS — E1165 Type 2 diabetes mellitus with hyperglycemia: Secondary | ICD-10-CM | POA: Diagnosis not present

## 2023-10-16 NOTE — Telephone Encounter (Signed)
 Pt called asking if the PA could give her a call she said she a her apt yesterday for her sx cl and she stated everything thing went good.

## 2023-10-16 NOTE — Progress Notes (Signed)
 Pateitn states that she had labs and EKG done 10/15/2023 at [facility] with provider Norva Riffle. Called office [phone]) to have them fax over labs to avoid duplicate labs/EKG being done.

## 2023-10-16 NOTE — Telephone Encounter (Signed)
 Patient called to state that she saw her PCP yesterday.  She reported that her PCP did an EKG and ordered lab work on the patient.  She stated that her PCP is going to increase the dose of her amlodipine, but she reported that her PCP stated that she is okay with patient proceeding with surgery.  Patient states that she is going back to her PCP for another check prior to surgery.  Patient also stated that their office received to the clearance and will send it back soon.  I instructed the patient to call if she has any changes or has any questions about anything.

## 2023-10-21 DIAGNOSIS — E1142 Type 2 diabetes mellitus with diabetic polyneuropathy: Secondary | ICD-10-CM | POA: Diagnosis not present

## 2023-10-21 DIAGNOSIS — E1165 Type 2 diabetes mellitus with hyperglycemia: Secondary | ICD-10-CM | POA: Diagnosis not present

## 2023-10-21 DIAGNOSIS — E782 Mixed hyperlipidemia: Secondary | ICD-10-CM | POA: Diagnosis not present

## 2023-10-21 DIAGNOSIS — I10 Essential (primary) hypertension: Secondary | ICD-10-CM | POA: Diagnosis not present

## 2023-10-22 DIAGNOSIS — E559 Vitamin D deficiency, unspecified: Secondary | ICD-10-CM | POA: Diagnosis not present

## 2023-10-23 ENCOUNTER — Ambulatory Visit (HOSPITAL_BASED_OUTPATIENT_CLINIC_OR_DEPARTMENT_OTHER): Admitting: Anesthesiology

## 2023-10-23 ENCOUNTER — Ambulatory Visit (HOSPITAL_BASED_OUTPATIENT_CLINIC_OR_DEPARTMENT_OTHER)
Admission: RE | Admit: 2023-10-23 | Discharge: 2023-10-23 | Disposition: A | Discharge status: Home / Self Care | Attending: Plastic Surgery | Admitting: Plastic Surgery

## 2023-10-23 ENCOUNTER — Encounter (HOSPITAL_BASED_OUTPATIENT_CLINIC_OR_DEPARTMENT_OTHER): Payer: Self-pay | Admitting: Plastic Surgery

## 2023-10-23 ENCOUNTER — Encounter (HOSPITAL_BASED_OUTPATIENT_CLINIC_OR_DEPARTMENT_OTHER)
Admission: RE | Disposition: A | Discharge status: Home / Self Care | Payer: Self-pay | Source: Home / Self Care | Attending: Plastic Surgery

## 2023-10-23 ENCOUNTER — Other Ambulatory Visit: Payer: Self-pay

## 2023-10-23 DIAGNOSIS — M542 Cervicalgia: Secondary | ICD-10-CM

## 2023-10-23 DIAGNOSIS — E119 Type 2 diabetes mellitus without complications: Secondary | ICD-10-CM | POA: Diagnosis not present

## 2023-10-23 DIAGNOSIS — N62 Hypertrophy of breast: Secondary | ICD-10-CM

## 2023-10-23 DIAGNOSIS — M549 Dorsalgia, unspecified: Secondary | ICD-10-CM | POA: Diagnosis not present

## 2023-10-23 DIAGNOSIS — Z6841 Body Mass Index (BMI) 40.0 and over, adult: Secondary | ICD-10-CM | POA: Insufficient documentation

## 2023-10-23 DIAGNOSIS — Z01818 Encounter for other preprocedural examination: Secondary | ICD-10-CM

## 2023-10-23 DIAGNOSIS — Z79899 Other long term (current) drug therapy: Secondary | ICD-10-CM | POA: Diagnosis not present

## 2023-10-23 HISTORY — PX: BREAST REDUCTION SURGERY: SHX8

## 2023-10-23 SURGERY — BREAST REDUCTION WITH LIPOSUCTION
Anesthesia: General | Site: Breast | Laterality: Bilateral

## 2023-10-23 MED ORDER — BUPIVACAINE LIPOSOME 1.3 % IJ SUSP
INTRAMUSCULAR | Status: AC
  Filled 2023-10-23: qty 20

## 2023-10-23 MED ORDER — LIDOCAINE-EPINEPHRINE 1 %-1:100000 IJ SOLN
INTRAMUSCULAR | Status: DC | PRN
  Administered 2023-10-23: 40 mL

## 2023-10-23 MED ORDER — LIDOCAINE HCL (CARDIAC) PF 100 MG/5ML IV SOSY
PREFILLED_SYRINGE | INTRAVENOUS | Status: DC | PRN
  Administered 2023-10-23: 100 mg via INTRAVENOUS

## 2023-10-23 MED ORDER — OXYCODONE HCL 5 MG PO TABS
5.0000 mg | ORAL_TABLET | ORAL | Status: DC | PRN
  Administered 2023-10-23: 5 mg via ORAL

## 2023-10-23 MED ORDER — MIDAZOLAM HCL 5 MG/5ML IJ SOLN
INTRAMUSCULAR | Status: DC | PRN
  Administered 2023-10-23: 2 mg via INTRAVENOUS

## 2023-10-23 MED ORDER — SODIUM CHLORIDE 0.9 % IV SOLN: 250.0000 mL | INTRAVENOUS | Status: DC | PRN

## 2023-10-23 MED ORDER — SODIUM CHLORIDE 0.9% FLUSH: 3.0000 mL | INTRAVENOUS | Status: DC | PRN

## 2023-10-23 MED ORDER — HYDROMORPHONE HCL 1 MG/ML IJ SOLN
INTRAMUSCULAR | Status: AC
  Filled 2023-10-23: qty 0.5

## 2023-10-23 MED ORDER — DEXAMETHASONE SODIUM PHOSPHATE 10 MG/ML IJ SOLN
INTRAMUSCULAR | Status: AC
  Filled 2023-10-23: qty 1

## 2023-10-23 MED ORDER — SUCCINYLCHOLINE CHLORIDE 200 MG/10ML IV SOSY
PREFILLED_SYRINGE | INTRAVENOUS | Status: AC
  Filled 2023-10-23: qty 10

## 2023-10-23 MED ORDER — SODIUM CHLORIDE 0.9% FLUSH: 3.0000 mL | Freq: Two times a day (BID) | INTRAVENOUS | Status: DC

## 2023-10-23 MED ORDER — VASHE WOUND IRRIGATION OPTIME
TOPICAL | Status: DC | PRN
  Administered 2023-10-23: 34 [oz_av]

## 2023-10-23 MED ORDER — SODIUM CHLORIDE (PF) 0.9 % IJ SOLN
INTRAMUSCULAR | Status: AC
  Filled 2023-10-23: qty 40

## 2023-10-23 MED ORDER — LIDOCAINE-EPINEPHRINE 1 %-1:100000 IJ SOLN
INTRAMUSCULAR | Status: AC
  Filled 2023-10-23: qty 3

## 2023-10-23 MED ORDER — HYDROMORPHONE HCL 1 MG/ML IJ SOLN
INTRAMUSCULAR | Status: DC | PRN
  Administered 2023-10-23: .5 mg via INTRAVENOUS

## 2023-10-23 MED ORDER — FENTANYL CITRATE (PF) 100 MCG/2ML IJ SOLN
25.0000 ug | INTRAMUSCULAR | Status: DC | PRN
  Administered 2023-10-23: 50 ug via INTRAVENOUS

## 2023-10-23 MED ORDER — MIDAZOLAM HCL 2 MG/2ML IJ SOLN
INTRAMUSCULAR | Status: AC
  Filled 2023-10-23: qty 2

## 2023-10-23 MED ORDER — EPINEPHRINE PF 1 MG/ML IJ SOLN
INTRAMUSCULAR | Status: AC
  Filled 2023-10-23: qty 2

## 2023-10-23 MED ORDER — ROCURONIUM BROMIDE 10 MG/ML (PF) SYRINGE
PREFILLED_SYRINGE | INTRAVENOUS | Status: AC
  Filled 2023-10-23: qty 10

## 2023-10-23 MED ORDER — APREPITANT 40 MG PO CAPS
40.0000 mg | ORAL_CAPSULE | Freq: Once | ORAL | Status: AC
  Administered 2023-10-23: 40 mg via ORAL

## 2023-10-23 MED ORDER — OXYCODONE HCL 5 MG PO TABS
ORAL_TABLET | ORAL | Status: AC
  Filled 2023-10-23: qty 1

## 2023-10-23 MED ORDER — APREPITANT 40 MG PO CAPS
ORAL_CAPSULE | ORAL | Status: AC
  Filled 2023-10-23: qty 1

## 2023-10-23 MED ORDER — EPHEDRINE SULFATE (PRESSORS) 50 MG/ML IJ SOLN
INTRAMUSCULAR | Status: DC | PRN
  Administered 2023-10-23: 10 mg via INTRAVENOUS

## 2023-10-23 MED ORDER — ACETAMINOPHEN 325 MG RE SUPP: 650.0000 mg | RECTAL | Status: DC | PRN

## 2023-10-23 MED ORDER — CHLORHEXIDINE GLUCONATE CLOTH 2 % EX PADS: 6.0000 | MEDICATED_PAD | Freq: Once | CUTANEOUS | Status: DC

## 2023-10-23 MED ORDER — EPHEDRINE 5 MG/ML INJ
INTRAVENOUS | Status: AC
  Filled 2023-10-23: qty 5

## 2023-10-23 MED ORDER — FENTANYL CITRATE (PF) 100 MCG/2ML IJ SOLN
INTRAMUSCULAR | Status: AC
Start: 2023-10-23 — End: ?
  Filled 2023-10-23: qty 2

## 2023-10-23 MED ORDER — CEFAZOLIN SODIUM-DEXTROSE 2-4 GM/100ML-% IV SOLN
2.0000 g | INTRAVENOUS | Status: AC
  Administered 2023-10-23: 2 g via INTRAVENOUS

## 2023-10-23 MED ORDER — ROCURONIUM BROMIDE 100 MG/10ML IV SOLN
INTRAVENOUS | Status: DC | PRN
  Administered 2023-10-23: 80 mg via INTRAVENOUS

## 2023-10-23 MED ORDER — LACTATED RINGERS IV SOLN: INTRAVENOUS | Status: DC

## 2023-10-23 MED ORDER — PHENYLEPHRINE HCL-NACL 20-0.9 MG/250ML-% IV SOLN
INTRAVENOUS | Status: AC
  Filled 2023-10-23: qty 250

## 2023-10-23 MED ORDER — LIDOCAINE 2% (20 MG/ML) 5 ML SYRINGE
INTRAMUSCULAR | Status: AC
  Filled 2023-10-23: qty 5

## 2023-10-23 MED ORDER — PROPOFOL 10 MG/ML IV BOLUS
INTRAVENOUS | Status: DC | PRN
  Administered 2023-10-23: 150 mg via INTRAVENOUS

## 2023-10-23 MED ORDER — FENTANYL CITRATE (PF) 100 MCG/2ML IJ SOLN
INTRAMUSCULAR | Status: DC | PRN
  Administered 2023-10-23: 100 ug via INTRAVENOUS
  Administered 2023-10-23: 50 ug via INTRAVENOUS

## 2023-10-23 MED ORDER — PHENYLEPHRINE 80 MCG/ML (10ML) SYRINGE FOR IV PUSH (FOR BLOOD PRESSURE SUPPORT)
PREFILLED_SYRINGE | INTRAVENOUS | Status: AC
  Filled 2023-10-23: qty 10

## 2023-10-23 MED ORDER — SODIUM CHLORIDE 0.9 % IV SOLN
INTRAVENOUS | Status: DC | PRN
  Administered 2023-10-23: 40 mL

## 2023-10-23 MED ORDER — PHENYLEPHRINE HCL-NACL 20-0.9 MG/250ML-% IV SOLN: INTRAVENOUS | Status: DC | PRN

## 2023-10-23 MED ORDER — FENTANYL CITRATE (PF) 100 MCG/2ML IJ SOLN
INTRAMUSCULAR | Status: AC
  Filled 2023-10-23: qty 2

## 2023-10-23 MED ORDER — DEXAMETHASONE SODIUM PHOSPHATE 4 MG/ML IJ SOLN
INTRAMUSCULAR | Status: DC | PRN
  Administered 2023-10-23: 5 mg via INTRAVENOUS

## 2023-10-23 MED ORDER — LIDOCAINE HCL 1 % IJ SOLN
INTRAVENOUS | Status: DC | PRN
  Administered 2023-10-23: 750 mL

## 2023-10-23 MED ORDER — PHENYLEPHRINE HCL (PRESSORS) 10 MG/ML IV SOLN
INTRAVENOUS | Status: DC | PRN
  Administered 2023-10-23: 80 ug via INTRAVENOUS
  Administered 2023-10-23 (×2): 160 ug via INTRAVENOUS
  Administered 2023-10-23: 80 ug via INTRAVENOUS
  Administered 2023-10-23: 160 ug via INTRAVENOUS

## 2023-10-23 MED ORDER — BUPIVACAINE HCL (PF) 0.25 % IJ SOLN
INTRAMUSCULAR | Status: AC
  Filled 2023-10-23: qty 90

## 2023-10-23 MED ORDER — ATROPINE SULFATE 0.4 MG/ML IV SOLN
INTRAVENOUS | Status: AC
  Filled 2023-10-23: qty 1

## 2023-10-23 MED ORDER — LIDOCAINE HCL (PF) 1 % IJ SOLN
INTRAMUSCULAR | Status: AC
  Filled 2023-10-23: qty 180

## 2023-10-23 MED ORDER — SUGAMMADEX SODIUM 500 MG/5ML IV SOLN
INTRAVENOUS | Status: DC | PRN
  Administered 2023-10-23: 300 mg via INTRAVENOUS

## 2023-10-23 MED ORDER — ONDANSETRON HCL 4 MG/2ML IJ SOLN
INTRAMUSCULAR | Status: AC
  Filled 2023-10-23: qty 2

## 2023-10-23 MED ORDER — ACETAMINOPHEN 325 MG PO TABS: 650.0000 mg | ORAL_TABLET | ORAL | Status: DC | PRN

## 2023-10-23 SURGICAL SUPPLY — 64 items
BINDER BREAST LRG (GAUZE/BANDAGES/DRESSINGS) IMPLANT
BINDER BREAST MEDIUM (GAUZE/BANDAGES/DRESSINGS) IMPLANT
BINDER BREAST XLRG (GAUZE/BANDAGES/DRESSINGS) IMPLANT
BINDER BREAST XXLRG (GAUZE/BANDAGES/DRESSINGS) IMPLANT
BIOPATCH RED 1 DISK 7.0 (GAUZE/BANDAGES/DRESSINGS) IMPLANT
BLADE HEX COATED 2.75 (ELECTRODE) IMPLANT
BLADE KNIFE PERSONA 10 (BLADE) ×2 IMPLANT
BLADE SURG 15 STRL LF DISP TIS (BLADE) ×1 IMPLANT
CANISTER SUCT 1200ML W/VALVE (MISCELLANEOUS) ×1 IMPLANT
CLEANSER WND VASHE 34 (WOUND CARE) ×1 IMPLANT
COLLAGEN CELLERATERX 5 GRAM (Miscellaneous) IMPLANT
COVER BACK TABLE 60X90IN (DRAPES) ×1 IMPLANT
COVER MAYO STAND STRL (DRAPES) ×1 IMPLANT
DERMABOND ADVANCED .7 DNX12 (GAUZE/BANDAGES/DRESSINGS) ×2 IMPLANT
DRAIN CHANNEL 19F RND (DRAIN) IMPLANT
DRAPE LAPAROSCOPIC ABDOMINAL (DRAPES) ×1 IMPLANT
DRAPE UTILITY XL STRL (DRAPES) ×1 IMPLANT
DRSG MEPILEX POST OP 4X8 (GAUZE/BANDAGES/DRESSINGS) ×2 IMPLANT
DRSG TEGADERM 4X4.75 (GAUZE/BANDAGES/DRESSINGS) IMPLANT
ELECT BLADE 4.0 EZ CLEAN MEGAD (MISCELLANEOUS) ×1 IMPLANT
ELECT REM PT RETURN 9FT ADLT (ELECTROSURGICAL) ×1 IMPLANT
ELECTRODE BLDE 4.0 EZ CLN MEGD (MISCELLANEOUS) ×1 IMPLANT
ELECTRODE REM PT RTRN 9FT ADLT (ELECTROSURGICAL) ×1 IMPLANT
EVACUATOR SILICONE 100CC (DRAIN) IMPLANT
GAUZE PAD ABD 8X10 STRL (GAUZE/BANDAGES/DRESSINGS) ×2 IMPLANT
GLOVE BIO SURGEON STRL SZ 6.5 (GLOVE) ×2 IMPLANT
GLOVE BIO SURGEON STRL SZ7.5 (GLOVE) ×1 IMPLANT
GLOVE BIOGEL PI IND STRL 7.0 (GLOVE) IMPLANT
GLOVE BIOGEL PI IND STRL 8 (GLOVE) IMPLANT
GLOVE SURG SS PI 6.5 STRL IVOR (GLOVE) IMPLANT
GOWN STRL REUS W/ TWL LRG LVL3 (GOWN DISPOSABLE) ×2 IMPLANT
GOWN STRL REUS W/ TWL XL LVL3 (GOWN DISPOSABLE) IMPLANT
LINER CANISTER 1000CC FLEX (MISCELLANEOUS) ×1 IMPLANT
NDL FILTER BLUNT 18X1 1/2 (NEEDLE) IMPLANT
NDL HYPO 25X1 1.5 SAFETY (NEEDLE) ×2 IMPLANT
NEEDLE FILTER BLUNT 18X1 1/2 (NEEDLE) ×1 IMPLANT
NEEDLE HYPO 25X1 1.5 SAFETY (NEEDLE) ×2 IMPLANT
NS IRRIG 1000ML POUR BTL (IV SOLUTION) IMPLANT
PACK BASIN DAY SURGERY FS (CUSTOM PROCEDURE TRAY) ×1 IMPLANT
PAD ALCOHOL SWAB (MISCELLANEOUS) IMPLANT
PAD FOAM SILICONE BACKED (GAUZE/BANDAGES/DRESSINGS) IMPLANT
PENCIL SMOKE EVACUATOR (MISCELLANEOUS) ×1 IMPLANT
PIN SAFETY STERILE (MISCELLANEOUS) IMPLANT
SLEEVE SCD COMPRESS KNEE MED (STOCKING) ×1 IMPLANT
SPIKE FLUID TRANSFER (MISCELLANEOUS) IMPLANT
SPONGE T-LAP 18X18 ~~LOC~~+RFID (SPONGE) ×2 IMPLANT
STRIP SUTURE WOUND CLOSURE 1/2 (MISCELLANEOUS) ×4 IMPLANT
SUT MNCRL AB 4-0 PS2 18 (SUTURE) ×4 IMPLANT
SUT MON AB 3-0 SH27 (SUTURE) ×4 IMPLANT
SUT MON AB 5-0 PS2 18 (SUTURE) IMPLANT
SUT PDS 3-0 CT2 (SUTURE) ×5 IMPLANT
SUT PDS II 3-0 CT2 27 ABS (SUTURE) ×4 IMPLANT
SUT SILK 3 0 PS 1 (SUTURE) IMPLANT
SYR 50ML LL SCALE MARK (SYRINGE) IMPLANT
SYR BULB IRRIG 60ML STRL (SYRINGE) ×1 IMPLANT
SYR CONTROL 10ML LL (SYRINGE) ×2 IMPLANT
TAPE MEASURE VINYL STERILE (MISCELLANEOUS) IMPLANT
TOWEL GREEN STERILE FF (TOWEL DISPOSABLE) ×3 IMPLANT
TRAY DSU PREP LF (CUSTOM PROCEDURE TRAY) ×1 IMPLANT
TUBE CONNECTING 20X1/4 (TUBING) ×1 IMPLANT
TUBING INFILTRATION IT-10001 (TUBING) IMPLANT
TUBING SET GRADUATE ASPIR 12FT (MISCELLANEOUS) IMPLANT
UNDERPAD 30X36 HEAVY ABSORB (UNDERPADS AND DIAPERS) ×2 IMPLANT
YANKAUER SUCT BULB TIP NO VENT (SUCTIONS) ×1 IMPLANT

## 2023-10-23 NOTE — Progress Notes (Signed)
 Patient is a pleasant 48 year old female s/p bilateral breast reduction performed 10/23/2023 by Dr. Ulice Bold who joins via telephone for postoperative day 1 check-in.  Reviewed operative report and a little over 1000 g was removed from each breast at time of surgery.  Liposuction was performed to lateral aspects of breasts bilaterally.  Today, patient is doing well from a postoperative standpoint.  She does endorse some discomfort in the axillary regions bilaterally, L > R.  Not unexpected after liposuction.  No obvious asymmetric swelling.  However, it is relatively well-controlled with over-the-counter analgesics as well as oxycodone.  Discussed taking the NSAIDs and Tylenol regularly and using the oxycodone only as needed for breakthrough pain.  She is wearing her compressive binder 24/7.  She denies any leg swelling, chest pain, difficulty breathing, or fevers.  She is ambulatory.  Tolerating p.o. intake without difficulty.  Voiding urine, no BM yet.  Daughter is assisting with her postoperative recovery.  She plans to follow-up next week, as scheduled.  However, she understands that she can certainly call the office should she have any questions or concerns in interim.

## 2023-10-23 NOTE — Op Note (Signed)
 Breast Reduction Op note:    DATE OF PROCEDURE: 10/23/2023  LOCATION: [facility]  SURGEON: Foster Simpson, DO  ASSISTANT: Caroline More, PA  PREOPERATIVE DIAGNOSIS 1. Macromastia 2. Neck Pain 3. Back Pain  POSTOPERATIVE DIAGNOSIS 1. Macromastia 2. Neck Pain 3. Back Pain  PROCEDURES 1. Bilateral breast reduction.  Right reduction 1090 g, Left reduction 1050 g  COMPLICATIONS: None.  DRAINS: none  INDICATIONS FOR PROCEDURE Jennifer Bean is a 48 y.o. year-old female born on Feb 26, 1976,with a history of symptomatic macromastia with concomitant back pain, neck pain, shoulder grooving from her bra.   MRN: [id number]  CONSENT Informed consent was obtained directly from the patient. The risks, benefits and alternatives were fully discussed. Specific risks including but not limited to bleeding, infection, hematoma, seroma, scarring, pain, nipple necrosis, asymmetry, poor cosmetic results, and need for further surgery were discussed. The patient's questions were answered.  DESCRIPTION OF PROCEDURE  Patient was brought into the operating room and rested on the operating room table in the supine position.  SCDs were placed and appropriate padding was performed.  Antibiotics were given. The patient underwent general anesthesia and the chest was prepped and draped in a sterile fashion.  A timeout was performed and all information was confirmed to be correct by those in the room. Tumescent was placed in the lateral breast area and then liposuction was done to help improve the contour.   Right side: Preoperative markings were confirmed.  Incision lines were injected with local containing epinephrine.  After waiting for vasoconstriction, the marked lines were incised with a #15 blade.  A Wise-pattern superomedial breast reduction was performed by de-epithelializing the pedicle, using bovie to create the superomedial pedicle, and removing breast tissue from the  superior, lateral, and inferior portions of the breast.  Care was taken to not undermine the breast pedicle. Hemostasis was achieved.  Cellerate and experel were placed in the pocket. The nipple was gently rotated into position and the soft tissue closed with 4-0 Monocryl.   The pocket was irrigated and hemostasis confirmed.  The deep tissues were approximated with 3-0 PDS sutures.  The skin was closed with deep dermal 3-0 Monocryl and subcuticular 4-0 Monocryl sutures.  The nipple and skin flaps had good capillary refill at the end of the procedure.    Left side: Preoperative markings were confirmed.  Incision lines were injected with local containing epinephrine.  After waiting for vasoconstriction, the marked lines were incised with a #15 blade.  A Wise-pattern superomedial breast reduction was performed by de-epithelializing the pedicle, using bovie to create the superomedial pedicle, and removing breast tissue from the superior, lateral, and inferior portions of the breast.  Care was taken to not undermine the breast pedicle. Hemostasis was achieved.  Cellerate and experel were placed in the pocket. The nipple was gently rotated into position and the soft tissue was closed with 4-0 Monocryl.  The patient was sat upright and size and shape symmetry was confirmed.  The pocket was irrigated and hemostasis confirmed.  The deep tissues were approximated with 3-0 PDS sutures. The skin was closed with deep dermal 3-0 Monocryl and subcuticular 4-0 Monocryl sutures.  Dermabond was applied.  A breast binder and ABDs were placed.  The nipple and skin flaps had good capillary refill at the end of the procedure.  The patient tolerated the procedure well. The patient was allowed to wake from anesthesia and taken to the recovery room in satisfactory condition.  The advanced practice practitioner (  APP) assisted throughout the case.  The APP was essential in retraction and counter traction when needed to make the case  progress smoothly.  This retraction and assistance made it possible to see the tissue plans for the procedure.  The assistance was needed for blood control, tissue re-approximation and assisted with closure of the incision site.

## 2023-10-23 NOTE — Anesthesia Preprocedure Evaluation (Signed)
 Anesthesia Evaluation  Patient identified by MRN, date of birth, ID band Patient awake    Reviewed: Allergy & Precautions, NPO status , Patient's Chart, lab work & pertinent test results, reviewed documented beta blocker date and time   History of Anesthesia Complications Negative for: history of anesthetic complications  Airway Mallampati: III  TM Distance: >3 FB     Dental no notable dental hx.    Pulmonary neg shortness of breath, sleep apnea , neg recent URI, neg PE   breath sounds clear to auscultation       Cardiovascular hypertension, (-) CAD, (-) Past MI, (-) Cardiac Stents and (-) CABG  Rhythm:Regular Rate:Normal     Neuro/Psych neg Seizures  Anxiety     TIA   GI/Hepatic ,neg GERD  ,,(+) neg Cirrhosis        Endo/Other  diabetes    Renal/GU      Musculoskeletal   Abdominal   Peds  Hematology   Anesthesia Other Findings   Reproductive/Obstetrics                             Anesthesia Physical Anesthesia Plan  ASA: 2  Anesthesia Plan: General   Post-op Pain Management:    Induction: Intravenous  PONV Risk Score and Plan: 2 and Ondansetron and Dexamethasone  Airway Management Planned: Oral ETT  Additional Equipment:   Intra-op Plan:   Post-operative Plan: Extubation in OR  Informed Consent: I have reviewed the patients History and Physical, chart, labs and discussed the procedure including the risks, benefits and alternatives for the proposed anesthesia with the patient or authorized representative who has indicated his/her understanding and acceptance.     Dental advisory given  Plan Discussed with: CRNA  Anesthesia Plan Comments:        Anesthesia Quick Evaluation

## 2023-10-23 NOTE — Interval H&P Note (Signed)
 History and Physical Interval Note:  10/23/2023 7:10 AM  Jennifer Bean  has presented today for surgery, with the diagnosis of Hypertrophy of breast.  The various methods of treatment have been discussed with the patient and family. After consideration of risks, benefits and other options for treatment, the patient has consented to  Procedure(s): BREAST REDUCTION WITH LIPOSUCTION (Bilateral) as a surgical intervention.  The patient's history has been reviewed, patient examined, no change in status, stable for surgery.  I have reviewed the patient's chart and labs.  Questions were answered to the patient's satisfaction.     Alena Bills Bricen Victory

## 2023-10-23 NOTE — Discharge Instructions (Addendum)
INSTRUCTIONS FOR AFTER BREAST SURGERY   You will likely have some questions about what to expect following your operation.  The following information will help you and your family understand what to expect when you are discharged from the hospital.  It is important to follow these guidelines to help ensure a smooth recovery and reduce complication.  Postoperative instructions include information on: diet, wound care, medications and physical activity.  AFTER SURGERY Expect to go home after the procedure.  In some cases, you may need to spend one night in the hospital for observation.  DIET Breast surgery does not require a specific diet.  However, the healthier you eat the better your body will heal. It is important to increasing your protein intake.  This means limiting the foods with sugar and carbohydrates.  Focus on vegetables and some meat.  If you have liposuction during your procedure be sure to drink water.  If your urine is bright yellow, then it is concentrated, and you need to drink more water.  As a general rule after surgery, you should have 8 ounces of water every hour while awake.  If you find you are persistently nauseated or unable to take in liquids let us know.  NO TOBACCO USE or EXPOSURE.  This will slow your healing process and lead to a wound.  WOUND CARE Leave the binder on for 3 days . Use fragrance free soap like Dial, Dove or [place].   After 3 days you can remove the binder to shower. Once dry apply binder or sports bra. If you have liposuction you will have a soft and spongy dressing (Lipofoam) that helps prevent creases in your skin.  Remove before you shower and then replace it.  It is also available on [employer]. If you have steri-strips / tape directly attached to your skin leave them in place. It is OK to get these wet.   No baths, pools or hot tubs for four weeks. We close your incision to leave the smallest and best-looking scar. No ointment or creams on your incisions  for four weeks.  No Neosporin (Too many skin reactions).  A few weeks after surgery you can use Mederma and start massaging the scar. We ask you to wear your binder or sports bra for the first 6 weeks around the clock, including while sleeping. This provides added comfort and helps reduce the fluid accumulation at the surgery site. NO Ice or heating pads to the operative site.  You have a very high risk of a BURN before you feel the temperature change.  ACTIVITY No heavy lifting until cleared by the doctor.  This usually means no more than a half-gallon of milk.  It is OK to walk and climb stairs. Moving your legs is very important to decrease your risk of a blood clot.  It will also help keep you from getting deconditioned.  Every 1 to 2 hours get up and walk for 5 minutes. This will help with a quicker recovery back to normal.  Let pain be your guide so you don't do too much.  This time is for you to recover.  You will be more comfortable if you sleep and rest with your head elevated either with a few pillows under you or in a recliner.  No stomach sleeping for a three months.  WORK Everyone returns to work at different times. As a rough guide, most people take at least 1 - 2 weeks off prior to returning to work. If  you need documentation for your job, give the forms to the front staff at the clinic.  DRIVING Arrange for someone to bring you home from the hospital after your surgery.  You may be able to drive a few days after surgery but not while taking any narcotics or valium.  BOWEL MOVEMENTS Constipation can occur after anesthesia and while taking pain medication.  It is important to stay ahead for your comfort.  We recommend taking Milk of Magnesia (2 tablespoons; twice a day) while taking the pain pills.  MEDICATIONS You may be prescribed should start after surgery At your preoperative visit for you history and physical you may have been given the following medications: An antibiotic: Start  this medication when you get home and take according to the instructions on the bottle. Zofran 4 mg:  This is to treat nausea and vomiting.  You can take this every 6 hours as needed and only if needed. Valium 2 mg for breast cancer patients: This is for muscle tightness if you have an implant or expander. This will help relax your muscle which also helps with pain control.  This can be taken every 12 hours as needed. Don't drive after taking this medication. Norco (hydrocodone/acetaminophen) 5/325 mg:  This is only to be used after you have taken the Motrin or the Tylenol. Every 8 hours as needed.   Over the counter Medication to take: Ibuprofen (Motrin) 600 mg:  Take this every 6 hours.  If you have additional pain then take 500 mg of the Tylenol every 8 hours.  Only take the Norco after you have tried these two. MiraLAX or Milk of Magnesia: Take this according to the bottle if you take the [place] Call your surgeon's office if any of the following occur: Fever 101 degrees F or greater Excessive bleeding or fluid from the incision site. Pain that increases over time without aid from the medications Redness, warmth, or pus draining from incision sites Persistent nausea or inability to take in liquids Severe misshapen area that underwent the operation.  Post Anesthesia Home Care Instructions  Activity: Get plenty of rest for the remainder of the day. A responsible individual must stay with you for 24 hours following the procedure.  For the next 24 hours, DO NOT: -Drive a car -Paediatric nurse -Drink alcoholic beverages -Take any medication unless instructed by your physician -Make any legal decisions or sign important papers.  Meals: Start with liquid foods such as gelatin or soup. Progress to regular foods as tolerated. Avoid greasy, spicy, heavy foods. If nausea and/or vomiting occur, drink only clear liquids until the nausea and/or vomiting subsides. Call your  physician if vomiting continues.  Special Instructions/Symptoms: Your throat may feel dry or sore from the anesthesia or the breathing tube placed in your throat during surgery. If this causes discomfort, gargle with warm salt water. The discomfort should disappear within 24 hours.  If you had a scopolamine patch placed behind your ear for the management of post- operative nausea and/or vomiting:  1. The medication in the patch is effective for 72 hours, after which it should be removed.  Wrap patch in a tissue and discard in the trash. Wash hands thoroughly with soap and water. 2. You may remove the patch earlier than 72 hours if you experience unpleasant side effects which may include dry mouth, dizziness or visual disturbances. 3. Avoid touching the patch. Wash your hands with soap and water after contact with the patch.

## 2023-10-23 NOTE — Anesthesia Procedure Notes (Signed)
 Procedure Name: Intubation Date/Time: 10/23/2023 7:40 AM  Performed by: Ronnette Hila, CRNAPre-anesthesia Checklist: Patient identified, Emergency Drugs available, Suction available and Patient being monitored Patient Re-evaluated:Patient Re-evaluated prior to induction Oxygen Delivery Method: Circle system utilized Preoxygenation: Pre-oxygenation with 100% oxygen Induction Type: IV induction Ventilation: Mask ventilation without difficulty Laryngoscope Size: Mac and 3 Grade View: Grade I Tube type: Oral Tube size: 7.0 mm Number of attempts: 1 Airway Equipment and Method: Stylet and Oral airway Placement Confirmation: ETT inserted through vocal cords under direct vision, positive ETCO2 and breath sounds checked- equal and bilateral Secured at: 22 cm Tube secured with: Tape Dental Injury: Teeth and Oropharynx as per pre-operative assessment

## 2023-10-23 NOTE — Transfer of Care (Signed)
 Immediate Anesthesia Transfer of Care Note  Patient: Jennifer Bean  Procedure(s) Performed: BREAST REDUCTION WITH LIPOSUCTION (Bilateral: Breast)  Patient Location: PACU  Anesthesia Type:General  Level of Consciousness: awake, alert , and oriented  Airway & Oxygen Therapy: Patient Spontanous Breathing and Patient connected to face mask oxygen  Post-op Assessment: Report given to RN and Post -op Vital signs reviewed and stable  Post vital signs: Reviewed and stable  Last Vitals:  Vitals Value Taken Time  BP    Temp    Pulse 88 10/23/23 0959  Resp    SpO2 99 % 10/23/23 0959  Vitals shown include unfiled device data.  Last Pain:  Vitals:   10/23/23 0628  TempSrc: Temporal  PainSc: 0-No pain      Patients Stated Pain Goal: 4 (10/23/23 8657)  Complications: No notable events documented.

## 2023-10-24 ENCOUNTER — Encounter (HOSPITAL_BASED_OUTPATIENT_CLINIC_OR_DEPARTMENT_OTHER): Payer: Self-pay | Admitting: Plastic Surgery

## 2023-10-24 ENCOUNTER — Ambulatory Visit (INDEPENDENT_AMBULATORY_CARE_PROVIDER_SITE_OTHER): Admitting: Physician Assistant

## 2023-10-24 DIAGNOSIS — Z9889 Other specified postprocedural states: Secondary | ICD-10-CM

## 2023-10-24 LAB — SURGICAL PATHOLOGY

## 2023-10-24 NOTE — Anesthesia Postprocedure Evaluation (Signed)
 Anesthesia Post Note  Patient: Deserea R Grawe  Procedure(s) Performed: BREAST REDUCTION WITH LIPOSUCTION (Bilateral: Breast)     Patient location during evaluation: PACU Anesthesia Type: General Level of consciousness: awake and alert Pain management: pain level controlled Vital Signs Assessment: post-procedure vital signs reviewed and stable Respiratory status: spontaneous breathing, nonlabored ventilation, respiratory function stable and patient connected to nasal cannula oxygen Cardiovascular status: blood pressure returned to baseline and stable Postop Assessment: no apparent nausea or vomiting Anesthetic complications: no   No notable events documented.  Last Vitals:  Vitals:   10/23/23 1045 10/23/23 1108  BP: 115/72 (!) 150/89  Pulse: 83 93  Resp: (!) 26 19  Temp:  (!) 36.2 C  SpO2: 93% 95%    Last Pain:  Vitals:   10/23/23 1115  TempSrc:   PainSc: Asleep                 Mariann Barter

## 2023-10-30 ENCOUNTER — Ambulatory Visit: Admitting: Student

## 2023-10-30 VITALS — BP 137/89 | HR 78

## 2023-10-30 DIAGNOSIS — Z9889 Other specified postprocedural states: Secondary | ICD-10-CM

## 2023-10-30 NOTE — Progress Notes (Signed)
 Patient is a 48 year old female who underwent bilateral breast reduction with Dr. Ulice Bold on 10/23/2023.  Intraoperatively, patient had 1090 g removed from her right breast and 1050 g removed from her left breast.  She is 1 week postop and presents to the clinic today for postoperative follow-up.  Today, patient is accompanied by her daughter and her sister at bedside.  Patient reports she has been doing well.  She states she has had some pain, but has been taking Tylenol and ibuprofen and has been having to take less of the oxycodone.  Patient denies any fevers or chills.  She denies any drainage from either of her breast.  She reports that she has been eating and drinking without any issue.  She denies any nausea or vomiting.  She reports that she has been peeing okay, but has been having some difficulty with bowel movements.  She reports that she has been taking MiraLAX.  Chaperone present on exam.  On exam, patient is sitting upright in no acute distress.  Breasts are soft and fairly symmetric.  There is some swelling noted bilaterally, a little bit more noted to the left than the right.  There is no overlying erythema.  No obvious fluid collections palpated on exam.  There is a blister noted to the left lateral breast.  NAC's appear to be viable and sensation to the NAC's is intact bilaterally.  Dressings are in place over the incision and appear to be intact.  There is no active drainage noted on exam.  No obvious signs of infection.  Recommended that patient apply Vaseline and dressing over her blister daily and to closely monitor the blister.  Discussed with her to wear compression at all times and avoid vigorous activities.  Discussed with her about under her left breast.  Discussed that if her left breast becomes more swollen, red, painful, if she develops any fevers or chills or has any questions or concerns to let us know.  Patient expressed understanding.  Recommended that patient continue to  take MiraLAX.  Discussed with her that the oxycodone may be contributing to her constipation.  Recommended that she continue to drink plenty of water and eat plenty of fiber.  Patient expressed understanding.  Patient to follow back up next week.  I instructed her to call in the meantime she has any questions or concerns about anything.

## 2023-10-31 ENCOUNTER — Encounter: Admitting: Student

## 2023-11-06 ENCOUNTER — Ambulatory Visit (INDEPENDENT_AMBULATORY_CARE_PROVIDER_SITE_OTHER): Admitting: Student

## 2023-11-06 VITALS — BP 145/86 | HR 87

## 2023-11-06 DIAGNOSIS — Z9889 Other specified postprocedural states: Secondary | ICD-10-CM

## 2023-11-06 NOTE — Progress Notes (Signed)
 Patient is a 48 year old female who underwent bilateral breast reduction with Dr. Ulice Bold on 10/23/2023.  Patient is 2 weeks postop.  She presents to the clinic today for postoperative follow-up.  Patient was last seen in the clinic on 10/30/2023.  At this visit, patient was doing well.  Breasts were soft and symmetric.  There was a little bit of more swelling noted to the left than the right.  There is no overlying erythema.  NAC's were healthy.  Today, patient presents with her daughter at bedside.  She states that she is doing well.  She states that the left breast still feels a little bit more sore than the right.  She reports overall though that her pain has been improving.  She denies any fevers or chills.  She does report that her Steri-Strips are starting to smell.  Patient also states that she has not had a bowel movement since her previous encounter.  Chaperone present on exam.  On exam, patient is sitting upright in no acute distress.  Breasts are soft and fairly symmetric.  Left breast might be a little bit more swollen than the right, but it appears to be improved from previous exam.  There is no overlying erythema.  No obvious fluid collections palpated on exam.  No fluid wave appreciated to the left breast.  NAC's are healthy bilaterally.  Steri-Strips are in place.  These were removed without any difficulty.  The incisions appear to be overall intact and healing well.  There is a little bit of irritation noted at the inferior T-zone's bilaterally.  There are no open wounds noted.  No signs of infection.  Recommended that patient apply Vaseline to her incisions daily.  Discussed with her the importance of wearing her compression at all times and avoiding strenuous activities.  I instructed her to keep a close eye on her left breast.  Discussed with her that if she develops any redness, swelling, pain, drainage to give [place] a call.  Patient expressed understanding.  Patient to follow back up  at her scheduled appointment next week.  Instructed her to call if she has any questions or concerns about anything.  Pictures were obtained of the patient and placed in the chart with the patient's or guardian's permission.

## 2023-11-11 ENCOUNTER — Ambulatory Visit: Admitting: Plastic Surgery

## 2023-11-11 VITALS — BP 127/83 | HR 83

## 2023-11-11 DIAGNOSIS — N62 Hypertrophy of breast: Secondary | ICD-10-CM

## 2023-11-11 NOTE — Progress Notes (Signed)
 The patient is a 48 year old female here for follow-up after undergoing a bilateral breast reduction.  She had over 1000 g removed from both breasts.  She is doing remarkably well.  There is no sign of a hematoma or seroma.  She says there is a little bit of odor.  This might be because of the Vaseline she is putting on.  I would like her to get some Vashe and clean under the inframammary area once a day.  Continue with the sports bra and follow-up in 2 weeks.  Pictures were obtained of the patient and placed in the chart with the patient's or guardian's permission.

## 2023-11-25 ENCOUNTER — Encounter: Admitting: Student

## 2023-11-26 DIAGNOSIS — I10 Essential (primary) hypertension: Secondary | ICD-10-CM | POA: Diagnosis not present

## 2023-12-02 ENCOUNTER — Ambulatory Visit (INDEPENDENT_AMBULATORY_CARE_PROVIDER_SITE_OTHER): Admitting: Student

## 2023-12-02 VITALS — BP 131/86 | HR 79

## 2023-12-02 DIAGNOSIS — Z9889 Other specified postprocedural states: Secondary | ICD-10-CM

## 2023-12-02 DIAGNOSIS — E1165 Type 2 diabetes mellitus with hyperglycemia: Secondary | ICD-10-CM | POA: Diagnosis not present

## 2023-12-02 DIAGNOSIS — I10 Essential (primary) hypertension: Secondary | ICD-10-CM | POA: Diagnosis not present

## 2023-12-02 DIAGNOSIS — E782 Mixed hyperlipidemia: Secondary | ICD-10-CM | POA: Diagnosis not present

## 2023-12-02 DIAGNOSIS — E1142 Type 2 diabetes mellitus with diabetic polyneuropathy: Secondary | ICD-10-CM | POA: Diagnosis not present

## 2023-12-02 NOTE — Progress Notes (Signed)
 Patient is a 48 year old female who underwent bilateral breast reduction with Dr. Orin Birk on 10/23/2023. She is about 5.5 weeks postop. She presents to the clinic today for postoperative follow up.      Patient was last seen in the clinic on 11/11/2023.  At this visit, patient was doing well.  There is no sign of hematoma or seroma.  Today, patient reports she is doing well.  She reports that she has a few sutures that have been sticking out of her incisions.  She otherwise denies any new complaints or concerns.  She denies any fevers or chills.  She states that overall she is very happy with her results.  Chaperone present on exam.  On exam, patient is sitting upright in no acute distress.  Breasts are soft and symmetric.  There is no overlying erythema.  No fluid collections palpated on exam.  NAC's appear to be healthy bilaterally.  There was several small sutures noted throughout the incisions that were cut and removed.  To the superior left NAC and to the lateral right NAC, there was some irritation from where the sutures were.  Incisions are otherwise intact and well-healed.  There are no signs of infection on exam.  Recommended that patient apply Vaseline to the areas of irritation where the sutures were.  Discussed with her that I would like her to apply the Vaseline to these areas for 1 to 2 weeks.  I discussed with her that otherwise, she may start scar creams or scar tapes throughout the remainder of her incisions.  We discussed options such as Mederma, silicone-based scar creams and silicone tapes.  Patient expressed understanding.  Discussed with the patient that as of next week, she no longer has to wear her compression bra and may transition into a regular bra without underwire.  Also discussed with her that she may start gradually increasing her activities.  Patient expressed understanding.  Patient to follow back up as needed.  I instructed her to call if she has any questions or  concerns about anything.

## 2023-12-05 DIAGNOSIS — L918 Other hypertrophic disorders of the skin: Secondary | ICD-10-CM | POA: Diagnosis not present

## 2023-12-05 DIAGNOSIS — L811 Chloasma: Secondary | ICD-10-CM | POA: Diagnosis not present

## 2024-01-15 DIAGNOSIS — E559 Vitamin D deficiency, unspecified: Secondary | ICD-10-CM | POA: Diagnosis not present

## 2024-01-27 ENCOUNTER — Other Ambulatory Visit: Payer: Self-pay | Admitting: Physician Assistant

## 2024-01-27 ENCOUNTER — Ambulatory Visit (INDEPENDENT_AMBULATORY_CARE_PROVIDER_SITE_OTHER): Admitting: Physician Assistant

## 2024-01-27 VITALS — BP 147/97 | HR 96

## 2024-01-27 DIAGNOSIS — Z9889 Other specified postprocedural states: Secondary | ICD-10-CM

## 2024-01-27 DIAGNOSIS — N6311 Unspecified lump in the right breast, upper outer quadrant: Secondary | ICD-10-CM | POA: Diagnosis not present

## 2024-01-27 NOTE — Progress Notes (Signed)
 Referring Provider Rosalea Rosina SAILOR, PA [address] [place],  [place] [address]   CC:  Chief Complaint  Patient presents with   Follow-up      Jennifer Bean is an 48 y.o. female.  HPI: Patient a pleasant 48 year old female with history of bilateral breast reduction surgery 10/23/2023 with Dr. Lowery who presents to clinic for follow-up concerns.  Reviewed operative report and over 1000 g was removed from each breast.  She was last seen here in clinic on 12/02/2023.  At that time, breasts were soft and symmetric without any palpable fluid collections.  Discussed transition to silicone scar tapes and lifting of postoperative restrictions.  Follow-up only as needed.  Today, patient is accompanied by daughter at bedside.  She states that she had an area of firmness on the left breast and she was concerned that it has since moved to the right side.  Her left breast is reportedly now soft, but her right side now has an area of firmness at the 10 o'clock position.  Daughter believes that it could be fluid.  Screening mammogram 05/2023 BI-RADS Category 1: Negative.  Allergies  Allergen Reactions   Latex Swelling    Irritation Family history     Outpatient Encounter Medications as of 01/27/2024  Medication Sig   amLODipine  (NORVASC ) 5 MG tablet Take 10 mg by mouth daily.   atorvastatin  (LIPITOR) 80 MG tablet Take 80 mg by mouth daily.   cholecalciferol  (VITAMIN D) 1000 units tablet Take 1,000 Units by mouth daily.   DULoxetine (CYMBALTA) 60 MG capsule Take 120 mg by mouth daily.   gabapentin  (NEURONTIN ) 300 MG capsule Take 300 mg by mouth 3 (three) times daily.   metoprolol tartrate (LOPRESSOR) 50 MG tablet Take 50 mg by mouth 2 (two) times daily.   MOUNJARO 5 MG/0.5ML Pen Inject into the skin.   ondansetron  (ZOFRAN ) 4 MG tablet Take 1 tablet (4 mg total) by mouth every 8 (eight) hours as needed for up to 20 doses for nausea or vomiting.   oxyCODONE  (ROXICODONE ) 5 MG immediate  release tablet Take 1 tablet (5 mg total) by mouth every 6 (six) hours as needed for up to 20 doses for severe pain (pain score 7-10).   ALPRAZolam (XANAX) 1 MG tablet Take by mouth. (Patient not taking: Reported on 01/27/2024)   ibuprofen  (ADVIL ,MOTRIN ) 800 MG tablet Take 400-800 mg by mouth 2 (two) times daily as needed for moderate pain (takes 0.5-1 depending on pain level).  (Patient not taking: Reported on 01/27/2024)   tiZANidine (ZANAFLEX) 4 MG tablet Take 1 tablet by mouth every 8 (eight) hours as needed. (Patient not taking: Reported on 01/27/2024)   No facility-administered encounter medications on file as of 01/27/2024.     Past Medical History:  Diagnosis Date   Anxiety    DM (diabetes mellitus) (HCC)    Dysmenorrhea 11/04/2016   High cholesterol    Hypertension    Pyrexia of unknown origin 11/13/2016   Sleep apnea    questionable had sleep study performed twice unsuccessful, needs to reschedule   TIA (transient ischemic attack) 2014   no residual   Vitamin D deficiency     Past Surgical History:  Procedure Laterality Date   BREAST REDUCTION SURGERY Bilateral 10/23/2023   Procedure: BREAST REDUCTION WITH LIPOSUCTION;  Surgeon: Lowery Estefana RAMAN, DO;  Location: [facility];  Service: Plastics;  Laterality: Bilateral;   CHOLECYSTECTOMY     ROBOTIC ASSISTED TOTAL HYSTERECTOMY WITH SALPINGECTOMY Bilateral  11/04/2016   Procedure: ROBOTIC ASSISTED TOTAL HYSTERECTOMY WITH SALPINGECTOMY;  Surgeon: Robbi Render, MD;  Location: [facility];  Service: Gynecology;  Laterality: Bilateral;   TUBAL LIGATION      No family history on file.  Social History   Social History Narrative   Not on file     Review of Systems General: Denies fevers or chills Cardio: Denies chest pain Pulmonary: Denies difficulty breathing  Physical Exam    01/27/2024    1:38 PM 12/02/2023   10:46 AM 11/11/2023    1:29 PM  Vitals with BMI  Systolic 147 131 872  Diastolic 97 86 83  Pulse 96 79 83     General:  No acute distress, nontoxic appearing  Respiratory: No increased work of breathing Neuro: Alert and oriented Psychiatric: Normal mood and affect  Breasts: Breasts have excellent shape and symmetry.  Soft throughout with the exception of an approximately 4 x 2.5 centimeter discrete mass at 10 o'clock position right breast.  Feels mobile.  No fluid wave shift or palpable underlying fluid collection.  NAC's are healthy and sensitive to light touch bilaterally.  Incisions CDI throughout, well-healed.  No crepitus or fluctuance.  Assessment/Plan  S/p breast reduction 09/2023:  Patient's discrete area of firmness is likely reflective of fat necrosis versus small hematoma versus scar tissue.  Lower suspicion for malignancy, abscess, or seroma based on exam and her reported history.  She denies any history of fibrocystic breast changes or cystic lesions.  Patient reports that she had a very similar area of firmness in the left breast that developed after surgery and has since spontaneously resolved.  Area is relatively nontender and there are no overlying skin changes.  Nothing on exam concerning for infection or underlying abscess.  She is well-appearing and has an excellent result from her reduction surgery.  Given the mass, not unreasonable to proceed with ultrasound imaging for further evaluation and help exclude more urgent pathology.  Patient states that she would prefer to focus on mechanical massage to see if it improves spontaneously like it did on the contralateral side.  Follow-up as needed, particularly if it fails to improve and she moves forward with imaging.  Patient is understanding agreeable to the plan.  Honora Seip PA-C 01/27/2024, 3:31 PM

## 2024-02-18 ENCOUNTER — Ambulatory Visit
Admission: RE | Admit: 2024-02-18 | Discharge: 2024-02-18 | Disposition: A | Discharge status: Home / Self Care | Source: Ambulatory Visit | Attending: Physician Assistant | Admitting: Physician Assistant

## 2024-02-18 ENCOUNTER — Ambulatory Visit
Admission: RE | Admit: 2024-02-18 | Discharge: 2024-02-18 | Disposition: A | Discharge status: Home / Self Care | Source: Ambulatory Visit | Attending: Physician Assistant

## 2024-02-18 DIAGNOSIS — Z9889 Other specified postprocedural states: Secondary | ICD-10-CM

## 2024-02-18 DIAGNOSIS — N6311 Unspecified lump in the right breast, upper outer quadrant: Secondary | ICD-10-CM

## 2024-02-18 DIAGNOSIS — N6313 Unspecified lump in the right breast, lower outer quadrant: Secondary | ICD-10-CM | POA: Diagnosis not present

## 2024-02-18 DIAGNOSIS — N6489 Other specified disorders of breast: Secondary | ICD-10-CM | POA: Diagnosis not present

## 2024-02-19 ENCOUNTER — Ambulatory Visit: Payer: Self-pay | Admitting: Physician Assistant

## 2024-02-19 NOTE — Progress Notes (Signed)
 This patient had a reduction 4 months ago. Came in for evaluation a few weeks ago for a relatively new onset area of firmness right breast for which I had imaging obtained. Evidently it's a ~ 7 x 6 x 4 cm seroma.   I was planning on having her return for reexamination and then possible US -guided drain placement, but wanted to first run it by you.

## 2024-02-27 ENCOUNTER — Ambulatory Visit: Admitting: Physician Assistant

## 2024-02-27 DIAGNOSIS — N6489 Other specified disorders of breast: Secondary | ICD-10-CM | POA: Diagnosis not present

## 2024-02-27 DIAGNOSIS — L7634 Postprocedural seroma of skin and subcutaneous tissue following other procedure: Secondary | ICD-10-CM

## 2024-02-27 DIAGNOSIS — Z9889 Other specified postprocedural states: Secondary | ICD-10-CM

## 2024-02-27 NOTE — Progress Notes (Signed)
 Referring Provider Rosalea Rosina SAILOR, PA [address] [place],  [place] [address]   CC:  Chief Complaint  Patient presents with   Follow-up      Jennifer Bean is an 48 y.o. female.  HPI: Patient is a pleasant 48 year old female with history of bilateral breast reduction 10/03/2023 with Dr. Lowery who presents to clinic for follow-up after imaging.  She was seen here in clinic on 01/27/2024 after noticing an area of firmness in the right breast.  She states that it moved from the left breast which was previously firm, but is now soft.  Daughter believes that it could be fluid.  On exam, the area of firmness was approximately 4 x 2.5 cm in size at the 10 o'clock position right breast.  Imaging was ordered.  Imaging obtained 02/18/2024 reveals a 7 x 5 x 6 cm fluid collection consistent with postoperative seroma at the 9 o'clock position, 5 cm from the nipple.  BI-RADS Category 2: Benign.  The patient, Jennifer Bean, gave permission to have this encounter performed via telemedicine, two identifiers were used to confirm patient's identity.  They also consented to chart review and treatment via this encounter.  The patient was at home and this provider was calling from their office.  A total of 15 minutes was spent speaking with the patient and reviewing chart.    Today, patient tells me that she is relatively unchanged from last encounter.  She denies any redness and states that it is not particularly bothersome.  She can however noticed that shift sometimes when she is moving and now that she knows its there it is hard not to appreciate it.   Allergies  Allergen Reactions   Latex Swelling    Irritation Family history     Outpatient Encounter Medications as of 02/27/2024  Medication Sig   ALPRAZolam (XANAX) 1 MG tablet Take by mouth. (Patient not taking: Reported on 01/27/2024)   amLODipine  (NORVASC ) 5 MG tablet Take 10 mg by mouth daily.   atorvastatin  (LIPITOR) 80 MG tablet Take 80 mg  by mouth daily.   cholecalciferol  (VITAMIN D) 1000 units tablet Take 1,000 Units by mouth daily.   DULoxetine (CYMBALTA) 60 MG capsule Take 120 mg by mouth daily.   gabapentin  (NEURONTIN ) 300 MG capsule Take 300 mg by mouth 3 (three) times daily.   ibuprofen  (ADVIL ,MOTRIN ) 800 MG tablet Take 400-800 mg by mouth 2 (two) times daily as needed for moderate pain (takes 0.5-1 depending on pain level).  (Patient not taking: Reported on 01/27/2024)   metoprolol tartrate (LOPRESSOR) 50 MG tablet Take 50 mg by mouth 2 (two) times daily.   MOUNJARO 5 MG/0.5ML Pen Inject into the skin.   ondansetron  (ZOFRAN ) 4 MG tablet Take 1 tablet (4 mg total) by mouth every 8 (eight) hours as needed for up to 20 doses for nausea or vomiting.   oxyCODONE  (ROXICODONE ) 5 MG immediate release tablet Take 1 tablet (5 mg total) by mouth every 6 (six) hours as needed for up to 20 doses for severe pain (pain score 7-10).   tiZANidine (ZANAFLEX) 4 MG tablet Take 1 tablet by mouth every 8 (eight) hours as needed. (Patient not taking: Reported on 01/27/2024)   No facility-administered encounter medications on file as of 02/27/2024.     Past Medical History:  Diagnosis Date   Anxiety    DM (diabetes mellitus) (HCC)    Dysmenorrhea 11/04/2016   High cholesterol    Hypertension    Pyrexia of  unknown origin 11/13/2016   Sleep apnea    questionable had sleep study performed twice unsuccessful, needs to reschedule   TIA (transient ischemic attack) 2014   no residual   Vitamin D deficiency     Past Surgical History:  Procedure Laterality Date   BREAST REDUCTION SURGERY Bilateral 10/23/2023   Procedure: BREAST REDUCTION WITH LIPOSUCTION;  Surgeon: Lowery Estefana RAMAN, DO;  Location: [facility];  Service: Plastics;  Laterality: Bilateral;   CHOLECYSTECTOMY     REDUCTION MAMMAPLASTY     ROBOTIC ASSISTED TOTAL HYSTERECTOMY WITH SALPINGECTOMY Bilateral 11/04/2016   Procedure: ROBOTIC ASSISTED TOTAL HYSTERECTOMY WITH  SALPINGECTOMY;  Surgeon: Robbi Render, MD;  Location: [facility];  Service: Gynecology;  Laterality: Bilateral;   TUBAL LIGATION      No family history on file.  Social History   Social History Narrative   Not on file     Review of Systems General: Denies fevers or chills Cardio: Denies chest pain Pulmonary: Denies difficulty breathing  Physical Exam    01/27/2024    1:38 PM 12/02/2023   10:46 AM 11/11/2023    1:29 PM  Vitals with BMI  Systolic 147 131 872  Diastolic 97 86 83  Pulse 96 79 83    N/A due to telephone encounter.  Assessment/Plan  S/p bilateral breast reduction with right breast seroma:  Discussed case with Dr. Lowery who personally reviewed imaging.  Agreed with assessment for ultrasound-guided aspiration versus possible aspiration attempt here in the office.    After discussion with patient, she would prefer an ultrasound-guided aspiration of the seroma.  She will need immediate postprocedural compression to help mitigate risk of recurrence.  Will place orders.  She understands to reach back out to the office if she does not hear about scheduling in the next week.   Honora Seip PA-C 02/27/2024, 12:51 PM

## 2024-03-01 ENCOUNTER — Encounter (HOSPITAL_COMMUNITY): Payer: Self-pay

## 2024-03-01 NOTE — Progress Notes (Signed)
 Jennifer Sieving, MD  Jennifer Bean Procedure [facility] for US  drain placement Pt will need to bring chest binder for postop compression  DDH       Previous Messages    ----- Message ----- From: Jennifer Bean Sent: 02/27/2024   1:30 PM EDT To: Jennifer Bean; Jennifer Bean Subject: Jennifer US  Guide Bx Asp/Drain                      Procedure : Jennifer US  Guide Bx Asp/Drain  Reason: seroma, right breast Dx: S/P bilateral breast reduction [S01.109 (ICD-10-CM)]; Seroma of breast [N64.89 (ICD-10-CM)]  Ordering Comments  Patient with a right breast seroma s/p reduction surgery 5 months ago.  Lower suspicion for abscess. Patient will need immediate post-procedural compression to help mitigate recurrence. Thank you!    History : US  limited axilla , mammogram  Provider : Landy Honora CROME, PA-C  Contact :  [phone]

## 2024-03-18 ENCOUNTER — Ambulatory Visit (HOSPITAL_COMMUNITY)

## 2024-03-18 ENCOUNTER — Ambulatory Visit: Payer: Self-pay | Admitting: Physician Assistant

## 2024-03-18 ENCOUNTER — Ambulatory Visit (HOSPITAL_COMMUNITY)
Admission: RE | Admit: 2024-03-18 | Discharge: 2024-03-18 | Disposition: A | Discharge status: Home / Self Care | Source: Ambulatory Visit | Attending: Physician Assistant | Admitting: Physician Assistant

## 2024-03-18 DIAGNOSIS — Z9889 Other specified postprocedural states: Secondary | ICD-10-CM | POA: Insufficient documentation

## 2024-03-18 DIAGNOSIS — N61 Mastitis without abscess: Secondary | ICD-10-CM | POA: Diagnosis not present

## 2024-03-18 DIAGNOSIS — N6489 Other specified disorders of breast: Secondary | ICD-10-CM | POA: Insufficient documentation

## 2024-03-18 DIAGNOSIS — L7634 Postprocedural seroma of skin and subcutaneous tissue following other procedure: Secondary | ICD-10-CM | POA: Diagnosis not present

## 2024-03-18 HISTORY — PX: IR US GUIDE BX ASP/DRAIN: IMG2392

## 2024-03-18 MED ORDER — LIDOCAINE-EPINEPHRINE 1 %-1:100000 IJ SOLN
INTRAMUSCULAR | Status: AC
  Filled 2024-03-18: qty 1

## 2024-03-18 NOTE — Progress Notes (Signed)
 Please see results.

## 2024-03-18 NOTE — Procedures (Signed)
 Interventional Radiology Procedure Note  Procedure: US  ASP RT BREAST HEMATOMA    Complications: None  Estimated Blood Loss:  0  Findings: 30CC OLD DARK BLOOD ASPIRATED CYTO AND CX SENT     EMERSON FREDERIC SPECKING, MD

## 2024-03-22 DIAGNOSIS — I10 Essential (primary) hypertension: Secondary | ICD-10-CM | POA: Diagnosis not present

## 2024-03-22 LAB — CYTOLOGY - NON PAP

## 2024-03-23 LAB — AEROBIC/ANAEROBIC CULTURE W GRAM STAIN (SURGICAL/DEEP WOUND)
Culture: NO GROWTH
Gram Stain: NONE SEEN

## 2024-04-02 DIAGNOSIS — R7989 Other specified abnormal findings of blood chemistry: Secondary | ICD-10-CM | POA: Diagnosis not present

## 2024-04-02 DIAGNOSIS — E1122 Type 2 diabetes mellitus with diabetic chronic kidney disease: Secondary | ICD-10-CM | POA: Diagnosis not present

## 2024-04-02 DIAGNOSIS — E7800 Pure hypercholesterolemia, unspecified: Secondary | ICD-10-CM | POA: Diagnosis not present

## 2024-04-02 DIAGNOSIS — E559 Vitamin D deficiency, unspecified: Secondary | ICD-10-CM | POA: Diagnosis not present

## 2024-04-14 DIAGNOSIS — R7989 Other specified abnormal findings of blood chemistry: Secondary | ICD-10-CM | POA: Diagnosis not present

## 2024-04-14 DIAGNOSIS — E1122 Type 2 diabetes mellitus with diabetic chronic kidney disease: Secondary | ICD-10-CM | POA: Diagnosis not present

## 2024-04-14 DIAGNOSIS — E559 Vitamin D deficiency, unspecified: Secondary | ICD-10-CM | POA: Diagnosis not present

## 2024-04-14 DIAGNOSIS — D473 Essential (hemorrhagic) thrombocythemia: Secondary | ICD-10-CM | POA: Diagnosis not present

## 2024-04-14 DIAGNOSIS — D649 Anemia, unspecified: Secondary | ICD-10-CM | POA: Diagnosis not present

## 2024-04-16 DIAGNOSIS — E782 Mixed hyperlipidemia: Secondary | ICD-10-CM | POA: Diagnosis not present

## 2024-04-16 DIAGNOSIS — E1142 Type 2 diabetes mellitus with diabetic polyneuropathy: Secondary | ICD-10-CM | POA: Diagnosis not present

## 2024-04-16 DIAGNOSIS — I10 Essential (primary) hypertension: Secondary | ICD-10-CM | POA: Diagnosis not present

## 2024-04-16 DIAGNOSIS — E1165 Type 2 diabetes mellitus with hyperglycemia: Secondary | ICD-10-CM | POA: Diagnosis not present

## 2024-04-16 DIAGNOSIS — E559 Vitamin D deficiency, unspecified: Secondary | ICD-10-CM | POA: Diagnosis not present

## 2024-04-16 DIAGNOSIS — K2100 Gastro-esophageal reflux disease with esophagitis, without bleeding: Secondary | ICD-10-CM | POA: Diagnosis not present

## 2024-04-16 DIAGNOSIS — Z0001 Encounter for general adult medical examination with abnormal findings: Secondary | ICD-10-CM | POA: Diagnosis not present

## 2024-05-05 DIAGNOSIS — E559 Vitamin D deficiency, unspecified: Secondary | ICD-10-CM | POA: Diagnosis not present

## 2024-05-12 ENCOUNTER — Other Ambulatory Visit: Payer: Self-pay | Admitting: Physician Assistant

## 2024-05-12 DIAGNOSIS — Z1231 Encounter for screening mammogram for malignant neoplasm of breast: Secondary | ICD-10-CM

## 2024-05-14 DIAGNOSIS — E782 Mixed hyperlipidemia: Secondary | ICD-10-CM | POA: Diagnosis not present

## 2024-05-14 DIAGNOSIS — E1142 Type 2 diabetes mellitus with diabetic polyneuropathy: Secondary | ICD-10-CM | POA: Diagnosis not present

## 2024-05-14 DIAGNOSIS — D75839 Thrombocytosis, unspecified: Secondary | ICD-10-CM | POA: Diagnosis not present

## 2024-05-14 DIAGNOSIS — I10 Essential (primary) hypertension: Secondary | ICD-10-CM | POA: Diagnosis not present

## 2024-05-14 DIAGNOSIS — E1165 Type 2 diabetes mellitus with hyperglycemia: Secondary | ICD-10-CM | POA: Diagnosis not present

## 2024-05-14 DIAGNOSIS — E559 Vitamin D deficiency, unspecified: Secondary | ICD-10-CM | POA: Diagnosis not present

## 2024-05-14 DIAGNOSIS — K2100 Gastro-esophageal reflux disease with esophagitis, without bleeding: Secondary | ICD-10-CM | POA: Diagnosis not present

## 2024-06-01 ENCOUNTER — Ambulatory Visit
Admission: RE | Admit: 2024-06-01 | Discharge: 2024-06-01 | Disposition: A | Discharge status: Home / Self Care | Source: Ambulatory Visit | Attending: Physician Assistant | Admitting: Physician Assistant

## 2024-06-01 DIAGNOSIS — Z1231 Encounter for screening mammogram for malignant neoplasm of breast: Secondary | ICD-10-CM

## 2024-06-04 ENCOUNTER — Inpatient Hospital Stay: Admitting: Oncology

## 2024-06-04 ENCOUNTER — Inpatient Hospital Stay: Attending: Oncology | Admitting: Oncology

## 2024-06-04 ENCOUNTER — Inpatient Hospital Stay

## 2024-06-04 VITALS — BP 129/84 | HR 81 | Temp 97.3°F | Resp 20 | Ht 62.0 in | Wt 230.2 lb

## 2024-06-04 DIAGNOSIS — D75839 Thrombocytosis, unspecified: Secondary | ICD-10-CM

## 2024-06-04 LAB — CMP (CANCER CENTER ONLY)
ALT: 21 U/L (ref 0–44)
AST: 16 U/L (ref 15–41)
Albumin: 4.4 g/dL (ref 3.5–5.0)
Alkaline Phosphatase: 67 U/L (ref 38–126)
Anion gap: 7 (ref 5–15)
BUN: 13 mg/dL (ref 6–20)
CO2: 23 mmol/L (ref 22–32)
Calcium: 9.4 mg/dL (ref 8.9–10.3)
Chloride: 108 mmol/L (ref 98–111)
Creatinine: 0.98 mg/dL (ref 0.44–1.00)
GFR, Estimated: >60 mL/min (ref >60)
Glucose, Bld: 81 mg/dL (ref 70–99)
Potassium: 3.7 mmol/L (ref 3.5–5.1)
Sodium: 138 mmol/L (ref 135–145)
Total Bilirubin: 0.7 mg/dL (ref 0.0–1.2)
Total Protein: 7.5 g/dL (ref 6.5–8.1)

## 2024-06-04 LAB — CBC WITH DIFFERENTIAL (CANCER CENTER ONLY)
Abs Immature Granulocytes: 0.03 K/uL (ref 0.00–0.07)
Basophils Absolute: 0 K/uL (ref 0.0–0.1)
Basophils Relative: 0 %
Eosinophils Absolute: 0.1 K/uL (ref 0.0–0.5)
Eosinophils Relative: 1 %
HCT: 40.7 % (ref 36.0–46.0)
Hemoglobin: 13.3 g/dL (ref 12.0–15.0)
Immature Granulocytes: 0 %
Lymphocytes Relative: 38 %
Lymphs Abs: 3 K/uL (ref 0.7–4.0)
MCH: 25.6 pg — ABNORMAL LOW (ref 26.0–34.0)
MCHC: 32.7 g/dL (ref 30.0–36.0)
MCV: 78.4 fL — ABNORMAL LOW (ref 80.0–100.0)
Monocytes Absolute: 0.4 K/uL (ref 0.1–1.0)
Monocytes Relative: 5 %
Neutro Abs: 4.5 K/uL (ref 1.7–7.7)
Neutrophils Relative %: 56 %
Platelet Count: 397 K/uL (ref 150–400)
RBC: 5.19 MIL/uL — ABNORMAL HIGH (ref 3.87–5.11)
RDW: 14.8 % (ref 11.5–15.5)
WBC Count: 8 K/uL (ref 4.0–10.5)
nRBC: 0 % (ref 0.0–0.2)

## 2024-06-04 LAB — C-REACTIVE PROTEIN: CRP: 0.5 mg/dL (ref <1.0)

## 2024-06-04 LAB — IRON AND IRON BINDING CAPACITY (CC-WL,HP ONLY)
Iron: 69 ug/dL (ref 28–170)
Saturation Ratios: 17 % (ref 10.4–31.8)
TIBC: 400 ug/dL (ref 250–450)
UIBC: 331 ug/dL (ref 148–442)

## 2024-06-04 LAB — FERRITIN: Ferritin: 262 ng/mL (ref 11–307)

## 2024-06-04 LAB — SEDIMENTATION RATE: Sed Rate: 20 mm/h (ref 0–22)

## 2024-06-04 LAB — TSH: TSH: 1.65 u[IU]/mL (ref 0.350–4.500)

## 2024-06-04 NOTE — Progress Notes (Signed)
 [facility]  HEMATOLOGY CLINIC CONSULTATION NOTE  PATIENT NAME: Jennifer Bean   MR#: [id number] DOB: 1976/04/07  DATE OF SERVICE: 06/04/2024  Patient Care Team: Rosalea Rosina SAILOR, PA as PCP - General (Physician Assistant)  REASON FOR CONSULTATION/ CHIEF COMPLAINT:  Thrombocytosis  ASSESSMENT & PLAN:  Jennifer Bean is a 48 y.o. lady with a past medical history of hypertension, type 2 diabetes mellitus, dyslipidemia, diabetic polyneuropathy, GERD, anxiety/depression, was referred to our service for evaluation of thrombocytosis.  Thrombocytosis Chronic thrombocytosis with platelet counts slightly elevated, ranging from 400,000 to 435,000.   No significant thrombotic events except a post-surgical hematoma.  Differential diagnosis includes reactive thrombocytosis due to iron deficiency, inflammation, splenic dysfunction, thyroid  dysfunction, or a bone marrow mutation.   Labs today actually showed normal platelet count of 397,000.  White count normal at 8000 with normal differential, hemoglobin 13.3, MCV 78.4.  Current platelet count does not necessitate intervention unless a mutation is detected.  - Ordered blood tests to evaluate iron levels, inflammation markers, thyroid  function, and bone marrow mutations. - Recommended daily aspirin 81 mg for thromboprophylaxis. - Scheduled follow-up call in 1-2 weeks to discuss test results. - Scheduled follow-up appointment in 4 months for repeat labs and evaluation.    I reviewed lab results and outside records for this visit and discussed relevant results with the patient. Diagnosis, plan of care and treatment options were also discussed in detail with the patient. Opportunity provided to ask questions and answers provided to her apparent satisfaction. Provided instructions to call our clinic with any problems, questions or concerns prior to return visit. I recommended to continue follow-up with PCP and sub-specialists.  She verbalized understanding and agreed with the plan. No barriers to learning was detected.  Chinita Patten, MD  06/04/2024 12:19 PM  [facility] - A DEPT OF [facility] [address] [place] [address] Dept: [phone] Dept Fax: [phone]   I spent a total of 55 minutes during this encounter with the patient including review of chart and various tests results, discussions about plan of care and coordination of care plan.  HISTORY OF PRESENTING ILLNESS:   Discussed the use of AI scribe software for clinical note transcription with the patient, who gave verbal consent to proceed.  History of Present Illness Jennifer Bean is a 48 year old female who presents with a high platelet count. She was referred by her primary care physician due to a high platelet count.  On 04/16/2024, labs at her PCPs office showed platelet count of 435,000.  White count was 6700 with normal differential.  Hemoglobin 13.1.  A referral was sent to us  for further evaluation of thrombocytosis.  Previous counts include 440,000 in May of the previous year and 400,000 in 2018. She has not had any recent blood work since the September test and is concerned about the implications of the high platelet count, questioning if it indicates 'thick blood'.  In March, she underwent breast reduction surgery, after which she developed a seroma that was drained and found to contain blood. She wonders if this could be related to her high platelet count, although she understands that post-surgical clots are common.  She is currently taking vitamin B complex and glycinate as advised by her weight loss doctor. No recent infections, sinus congestion, cough, burning during urination, skin rash, abdominal pain, unintentional weight loss, or lack of appetite. She reports no  history of blood clots in the legs or lungs, apart from the post-surgical clot.  Her family  history is notable for her mother having ovarian cancer at age 59. She is unsure if her mother underwent genetic testing for cancer-related genes.       MEDICAL HISTORY:  Past Medical History:  Diagnosis Date   Anxiety    DM (diabetes mellitus) (HCC)    Dysmenorrhea 11/04/2016   High cholesterol    Hypertension    Pyrexia of unknown origin 11/13/2016   Sleep apnea    questionable had sleep study performed twice unsuccessful, needs to reschedule   TIA (transient ischemic attack) 2014   no residual   Vitamin D deficiency     SURGICAL HISTORY: Past Surgical History:  Procedure Laterality Date   BREAST REDUCTION SURGERY Bilateral 10/23/2023   Procedure: BREAST REDUCTION WITH LIPOSUCTION;  Surgeon: Lowery Estefana RAMAN, DO;  Location: [facility];  Service: Plastics;  Laterality: Bilateral;   CHOLECYSTECTOMY     IR US  GUIDE BX ASP/DRAIN  03/18/2024   REDUCTION MAMMAPLASTY     ROBOTIC ASSISTED TOTAL HYSTERECTOMY WITH SALPINGECTOMY Bilateral 11/04/2016   Procedure: ROBOTIC ASSISTED TOTAL HYSTERECTOMY WITH SALPINGECTOMY;  Surgeon: Robbi Render, MD;  Location: [facility];  Service: Gynecology;  Laterality: Bilateral;   TUBAL LIGATION      SOCIAL HISTORY: She reports that she has never smoked. She has never used smokeless tobacco. She reports current alcohol use. She reports that she does not use drugs.  Social History   Socioeconomic History   Marital status: Single    Spouse name: Not on file   Number of children: Not on file   Years of education: Not on file   Highest education level: Not on file  Occupational History   Not on file  Tobacco Use   Smoking status: Never   Smokeless tobacco: Never  Substance and Sexual Activity   Alcohol use: Yes    Comment: social   Drug use: No   Sexual activity: Yes    Birth control/protection: Surgical  Other Topics Concern   Not on file  Social History Narrative   Not on file   Social Drivers of Health   Financial  Resource Strain: Not on file  Food Insecurity: No Food Insecurity (06/04/2024)   Hunger Vital Sign    Worried About Running Out of Food in the Last Year: Never true    Ran Out of Food in the Last Year: Never true  Transportation Needs: No Transportation Needs (06/04/2024)   PRAPARE - Administrator, Civil Service (Medical): No    Lack of Transportation (Non-Medical): No  Physical Activity: Not on file  Stress: Not on file  Social Connections: Unknown (12/09/2021)   Received from [facility]   Social Network    Social Network: Not on file  Intimate Partner Violence: Not At Risk (06/04/2024)   Humiliation, Afraid, Rape, and Kick questionnaire    Fear of Current or Ex-Partner: No    Emotionally Abused: No    Physically Abused: No    Sexually Abused: No    FAMILY HISTORY: Family History  Problem Relation Age of Onset   Breast cancer Neg Hx     ALLERGIES:  She is allergic to cefazolin  and latex.  MEDICATIONS:  Current Outpatient Medications  Medication Sig Dispense Refill   ALPRAZolam (XANAX) 1 MG tablet Take by mouth.     amLODipine  (NORVASC ) 5 MG tablet Take 10 mg by mouth daily.  0   atorvastatin  (LIPITOR) 80 MG tablet Take 80 mg by mouth daily.     cholecalciferol  (VITAMIN D) 1000 units tablet Take 1,000 Units by mouth daily.     DULoxetine (CYMBALTA) 60 MG capsule Take 120 mg by mouth daily.     gabapentin  (NEURONTIN ) 300 MG capsule Take 300 mg by mouth 3 (three) times daily.     ibuprofen  (ADVIL ,MOTRIN ) 800 MG tablet Take 400-800 mg by mouth 2 (two) times daily as needed for moderate pain (takes 0.5-1 depending on pain level).      JARDIANCE 10 MG TABS tablet Take 10 mg by mouth daily.     metoprolol tartrate (LOPRESSOR) 50 MG tablet Take 50 mg by mouth 2 (two) times daily.     MOUNJARO 5 MG/0.5ML Pen Inject into the skin.     ondansetron  (ZOFRAN ) 4 MG tablet Take 1 tablet (4 mg total) by mouth every 8 (eight) hours as needed for up to 20 doses for nausea or  vomiting. 20 tablet 0   oxyCODONE  (ROXICODONE ) 5 MG immediate release tablet Take 1 tablet (5 mg total) by mouth every 6 (six) hours as needed for up to 20 doses for severe pain (pain score 7-10). 20 tablet 0   tiZANidine (ZANAFLEX) 4 MG tablet Take 1 tablet by mouth every 8 (eight) hours as needed.     No current facility-administered medications for this visit.    REVIEW OF SYSTEMS:    Review of Systems - Oncology  All other pertinent systems were reviewed with the patient and are negative.  PHYSICAL EXAMINATION:    Onc Performance Status - 06/04/24 1157       ECOG Perf Status   ECOG Perf Status Fully active, able to carry on all pre-disease performance without restriction      KPS SCALE   KPS % SCORE Normal, no compliants, no evidence of disease           Vitals:   06/04/24 1139  BP: 129/84  Pulse: 81  Resp: 20  Temp: (!) 97.3 F (36.3 C)  SpO2: 100%   Filed Weights   06/04/24 1139  Weight: 230 lb 3.2 oz (104.4 kg)    Physical Exam Constitutional:      General: She is not in acute distress.    Appearance: Normal appearance.  HENT:     Head: Normocephalic and atraumatic.  Cardiovascular:     Rate and Rhythm: Normal rate.     Heart sounds: Normal heart sounds.  Pulmonary:     Effort: Pulmonary effort is normal. No respiratory distress.     Breath sounds: Normal breath sounds.  Abdominal:     General: There is no distension.  Lymphadenopathy:     Cervical: No cervical adenopathy.  Neurological:     General: No focal deficit present.     Mental Status: She is alert and oriented to person, place, and time.  Psychiatric:        Mood and Affect: Mood normal.        Behavior: Behavior normal.     LABORATORY DATA:   I have reviewed the data as listed  Results for orders placed or performed in visit on 06/04/24  CBC with Differential (Cancer Center Only)  Result Value Ref Range   WBC Count 8.0 4.0 - 10.5 K/uL   RBC 5.19 (H) 3.87 - 5.11 MIL/uL    Hemoglobin 13.3 12.0 - 15.0 g/dL   HCT 59.2 63.9 - 53.9 %   MCV 78.4 (  L) 80.0 - 100.0 fL   MCH 25.6 (L) 26.0 - 34.0 pg   MCHC 32.7 30.0 - 36.0 g/dL   RDW 85.1 88.4 - 84.4 %   Platelet Count 397 150 - 400 K/uL   nRBC 0.0 0.0 - 0.2 %   Neutrophils Relative % 56 %   Neutro Abs 4.5 1.7 - 7.7 K/uL   Lymphocytes Relative 38 %   Lymphs Abs 3.0 0.7 - 4.0 K/uL   Monocytes Relative 5 %   Monocytes Absolute 0.4 0.1 - 1.0 K/uL   Eosinophils Relative 1 %   Eosinophils Absolute 0.1 0.0 - 0.5 K/uL   Basophils Relative 0 %   Basophils Absolute 0.0 0.0 - 0.1 K/uL   Immature Granulocytes 0 %   Abs Immature Granulocytes 0.03 0.00 - 0.07 K/uL  CMP (Cancer Center only)  Result Value Ref Range   Sodium 138 135 - 145 mmol/L   Potassium 3.7 3.5 - 5.1 mmol/L   Chloride 108 98 - 111 mmol/L   CO2 23 22 - 32 mmol/L   Glucose, Bld 81 70 - 99 mg/dL   BUN 13 6 - 20 mg/dL   Creatinine 9.01 9.55 - 1.00 mg/dL   Calcium  9.4 8.9 - 10.3 mg/dL   Total Protein 7.5 6.5 - 8.1 g/dL   Albumin 4.4 3.5 - 5.0 g/dL   AST 16 15 - 41 U/L   ALT 21 0 - 44 U/L   Alkaline Phosphatase 67 38 - 126 U/L   Total Bilirubin 0.7 0.0 - 1.2 mg/dL   GFR, Estimated >39 >39 mL/min   Anion gap 7 5 - 15  TSH  Result Value Ref Range   TSH 1.650 0.350 - 4.500 uIU/mL  Sedimentation rate  Result Value Ref Range   Sed Rate 20 0 - 22 mm/hr  C-reactive protein  Result Value Ref Range   CRP 0.5 <1.0 mg/dL  Iron and Iron Binding Capacity ([facility] only)  Result Value Ref Range   Iron 69 28 - 170 ug/dL   TIBC 599 749 - 549 ug/dL   Saturation Ratios 17 10.4 - 31.8 %   UIBC 331 148 - 442 ug/dL  Ferritin  Result Value Ref Range   Ferritin 262 11 - 307 ng/mL  JAK2 V617F rfx CALR/MPL/E12-15  Result Value Ref Range   Specimen Type Blood    JAK2 V617F Result Comment    Reflex Comment    V617F Rfx CALR/MPL/E12-15 Bkgd Comment    Method based next generation sequencing.    References Comment    Director Review Comment   CALR +MPL +  E12-E15 (reflexed)  Result Value Ref Range   CALR Result Comment    MPL Result Comment    E12-15 Result Comment     RADIOGRAPHIC STUDIES:   MM 3D SCREENING MAMMOGRAM BILATERAL BREAST Result Date: 06/03/2024 CLINICAL DATA:  Screening. EXAM: DIGITAL SCREENING BILATERAL MAMMOGRAM WITH TOMOSYNTHESIS AND CAD TECHNIQUE: Bilateral screening digital craniocaudal and mediolateral oblique mammograms were obtained. Bilateral screening digital breast tomosynthesis was performed. The images were evaluated with computer-aided detection. COMPARISON:  Previous exam(s). ACR Breast Density Category b: There are scattered areas of fibroglandular density. FINDINGS: There are no findings suspicious for malignancy. Findings consistent with reduction mammoplasty noted in both breasts. IMPRESSION: No mammographic evidence of malignancy. A result letter of this screening mammogram will be mailed directly to the patient. RECOMMENDATION: Screening mammogram in one year. (Code:SM-B-01Y) BI-RADS CATEGORY  2: Benign. Electronically Signed   By: Rosina Vonzell HERO.D.  On: 06/03/2024 12:37    Orders Placed This Encounter  Procedures   CBC with Differential (Cancer Center Only)    Standing Status:   Future    Expiration Date:   06/04/2025   CMP (Cancer Center only)    Standing Status:   Future    Expiration Date:   06/04/2025   TSH    Standing Status:   Future    Expiration Date:   06/04/2025   Sedimentation rate    Standing Status:   Future    Expiration Date:   06/04/2025   C-reactive protein    Standing Status:   Future    Expiration Date:   06/04/2025   Iron and Iron Binding Capacity ([facility] only)    Standing Status:   Future    Expiration Date:   06/04/2025   Ferritin    Standing Status:   Future    Expiration Date:   06/04/2025   JAK2 V617F rfx CALR/MPL/E12-15    Standing Status:   Future    Expiration Date:   06/04/2025    No future appointments.    This document was completed utilizing speech  recognition software. Grammatical errors, random word insertions, pronoun errors, and incomplete sentences are an occasional consequence of this system due to software limitations, ambient noise, and hardware issues. Any formal questions or concerns about the content, text or information contained within the body of this dictation should be directly addressed to the provider for clarification.

## 2024-06-14 LAB — CALR +MPL + E12-E15  (REFLEX)

## 2024-06-14 LAB — JAK2 V617F RFX CALR/MPL/E12-15

## 2024-06-17 ENCOUNTER — Inpatient Hospital Stay: Admitting: Oncology

## 2024-06-17 ENCOUNTER — Encounter: Payer: Self-pay | Admitting: Oncology

## 2024-06-17 DIAGNOSIS — D75839 Thrombocytosis, unspecified: Secondary | ICD-10-CM

## 2024-06-17 NOTE — Assessment & Plan Note (Addendum)
 Chronic thrombocytosis with platelet counts slightly elevated, ranging from 400,000 to 435,000.   No significant thrombotic events except a post-surgical hematoma.  Differential diagnosis includes reactive thrombocytosis due to iron deficiency, inflammation, splenic dysfunction, thyroid  dysfunction, or a bone marrow mutation.   Labs today actually showed normal platelet count of 397,000.  White count normal at 8000 with normal differential, hemoglobin 13.3, MCV 78.4.  Current platelet count does not necessitate intervention unless a mutation is detected.  - Ordered blood tests to evaluate iron levels, inflammation markers, thyroid  function, and bone marrow mutations. - Recommended daily aspirin 81 mg for thromboprophylaxis. - Scheduled follow-up call in 1-2 weeks to discuss test results. - Scheduled follow-up appointment in 4 months for repeat labs and evaluation.

## 2024-06-17 NOTE — Progress Notes (Signed)
 "  [facility]  HEMATOLOGY-ONCOLOGY ELECTRONIC VISIT PROGRESS NOTE  PATIENT NAME: Jennifer Bean   MR#: [id number] DOB: [birth year]  DATE OF SERVICE: 06/17/2024  Patient Care Team: Rosalea Rosina SAILOR, PA as PCP - General (Physician Assistant)  I connected with the patient via telephone conference and verified that I am speaking with the correct person using two identifiers. The patient's location is at home and I am providing care from the [facility].  I discussed the limitations, risks, security and privacy concerns of performing an evaluation and management service by e-visits and the availability of in person appointments. I also discussed with the patient that there may be a patient responsible charge related to this service. The patient expressed understanding and agreed to proceed.   ASSESSMENT & PLAN:   Jennifer Bean is a 48 y.o.  lady with a past medical history of hypertension, type 2 diabetes mellitus, dyslipidemia, diabetic polyneuropathy, GERD, anxiety/depression, was referred to our service in November 2025 for evaluation of thrombocytosis.  Repeat platelet count was normal.  JAK2 testing and extended panel testing was negative.  Hx of Thrombocytosis Chronic thrombocytosis with platelet counts slightly elevated, ranging from 400,000 to 435,000.   No significant thrombotic events except a post-surgical hematoma.  Differential diagnoses considered included reactive thrombocytosis due to iron deficiency, inflammation, splenic dysfunction, thyroid  dysfunction, or a bone marrow mutation.    On her consultation with us  on 06/04/2024, labs actually showed normal platelet count of 397,000.  White count normal at 8000 with normal differential, hemoglobin 13.3, MCV 78.4.  Iron studies were well within normal limits.  ESR, CRP, TSH were all within normal limits.  JAK2 V617F mutation analysis, followed by reflex testing to include CALR, MPL, exon 12-15 mutations were  all negative.  No evidence of primary myeloproliferative neoplasm.    Current platelet count does not necessitate any hematological intervention.   - Continue multivitamin once daily. - Scheduled follow-up appointment in March to recheck blood counts. - Will discharge from office if blood counts remain normal at follow-up.     I discussed the assessment and treatment plan with the patient. The patient was provided an opportunity to ask questions and all were answered. The patient agreed with the plan and demonstrated an understanding of the instructions. The patient was advised to call back or seek an in-person evaluation if the symptoms worsen or if the condition fails to improve as anticipated.    I spent 12 minutes over the phone with the patient reviewing test results, discuss management and coordination/planning of care.  Chinita Patten, MD 06/17/2024 12:26 PM [facility] - A DEPT OF [facility] [address] [place] [address] Dept: [phone] Dept Fax: [phone]   INTERVAL HISTORY:  Please see above for problem oriented charting.  The purpose of today's discussion is to explain recent lab results and to formulate plan of care.  Discussed the use of AI scribe software for clinical note transcription with the patient, who gave verbal consent to proceed.  History of Present Illness Jennifer Bean is a 48 year old female who presents with fluctuating high platelet count. She was referred for evaluation of high platelet count.  She has experienced fluctuating platelet counts, with previous measurements slightly elevated at 427,000 and 435,000. In September, her platelet count was 435,000, slightly above normal, but recent tests show a count of 397,000, which is within the normal range.  Her white blood cell count, hemoglobin, and red blood cell count are all normal. Kidney function, liver function,  electrolytes, and iron levels are also normal. Inflammatory markers were checked and showed no signs of chronic inflammation. A test for bone marrow mutation, including an extended panel, was conducted and returned negative results.  No menstrual cycles or other bleeding problems. She is not currently taking aspirin.  SUMMARY OF HEMATOLOGY HISTORY:  She was referred by her primary care physician due to a high platelet count.   On 04/16/2024, labs at her PCPs office showed platelet count of 435,000.  White count was 6700 with normal differential.  Hemoglobin 13.1.  A referral was sent to us  for further evaluation of thrombocytosis.   Previous counts include 440,000 in May of the previous year and 400,000 in 2018. She has not had any recent blood work since the September test and is concerned about the implications of the high platelet count, questioning if it indicates 'thick blood'.   In March, she underwent breast reduction surgery, after which she developed a seroma that was drained and found to contain blood. She wonders if this could be related to her high platelet count, although she understands that post-surgical clots are common.   She is currently taking vitamin B complex and glycinate as advised by her weight loss doctor. No recent infections, sinus congestion, cough, burning during urination, skin rash, abdominal pain, unintentional weight loss, or lack of appetite. She reports no history of blood clots in the legs or lungs, apart from the post-surgical clot.   Her family history is notable for her mother having ovarian cancer at age 34. She is unsure if her mother underwent genetic testing for cancer-related genes.  Chronic thrombocytosis with platelet counts slightly elevated, ranging from 400,000 to 435,000.    No significant thrombotic events except a post-surgical hematoma.   Differential diagnoses considered included reactive thrombocytosis due to iron deficiency, inflammation,  splenic dysfunction, thyroid  dysfunction, or a bone marrow mutation.    On her consultation with us  on 06/04/2024, labs actually showed normal platelet count of 397,000.  White count normal at 8000 with normal differential, hemoglobin 13.3, MCV 78.4.  Iron studies were well within normal limits.  ESR, CRP, TSH were all within normal limits.  JAK2 V617F mutation analysis, followed by reflex testing to include CALR, MPL, exon 12-15 mutations were all negative.  No evidence of primary myeloproliferative neoplasm.    Current platelet count does not necessitate any hematological intervention.   - Scheduled follow-up appointment in 4 months for repeat labs and evaluation.  REVIEW OF SYSTEMS:    Review of Systems - Oncology  All other pertinent systems were reviewed with the patient and are negative.  I have reviewed the past medical history, past surgical history, social history and family history with the patient and they are unchanged from previous note.  ALLERGIES:  She is allergic to cefazolin  and latex.  MEDICATIONS:  Current Outpatient Medications  Medication Sig Dispense Refill   ALPRAZolam (XANAX) 1 MG tablet Take by mouth.     amLODipine  (NORVASC ) 5 MG tablet Take 10 mg by mouth daily.  0   atorvastatin  (LIPITOR) 80 MG tablet Take 80 mg by mouth daily.     cholecalciferol  (VITAMIN D) 1000 units tablet Take 1,000 Units by mouth daily.     DULoxetine (CYMBALTA) 60 MG capsule Take 120 mg by mouth daily.     gabapentin  (NEURONTIN ) 300 MG capsule Take 300 mg by mouth 3 (  three) times daily.     ibuprofen  (ADVIL ,MOTRIN ) 800 MG tablet Take 400-800 mg by mouth 2 (two) times daily as needed for moderate pain (takes 0.5-1 depending on pain level).      JARDIANCE 10 MG TABS tablet Take 10 mg by mouth daily.     metoprolol tartrate (LOPRESSOR) 50 MG tablet Take 50 mg by mouth 2 (two) times daily.     MOUNJARO 5 MG/0.5ML Pen Inject into the skin.     ondansetron  (ZOFRAN ) 4 MG tablet Take 1  tablet (4 mg total) by mouth every 8 (eight) hours as needed for up to 20 doses for nausea or vomiting. 20 tablet 0   oxyCODONE  (ROXICODONE ) 5 MG immediate release tablet Take 1 tablet (5 mg total) by mouth every 6 (six) hours as needed for up to 20 doses for severe pain (pain score 7-10). 20 tablet 0   tiZANidine (ZANAFLEX) 4 MG tablet Take 1 tablet by mouth every 8 (eight) hours as needed.     No current facility-administered medications for this visit.    PHYSICAL EXAMINATION:  Not performed today as it was a phone only visit  LABORATORY DATA:   I have reviewed the data as listed.  Recent Results (from the past 2160 hours)  CBC with Differential (Cancer Center Only)     Status: Abnormal   Collection Time: 06/04/24 12:31 PM  Result Value Ref Range   WBC Count 8.0 4.0 - 10.5 K/uL   RBC 5.19 (H) 3.87 - 5.11 MIL/uL   Hemoglobin 13.3 12.0 - 15.0 g/dL   HCT 59.2 63.9 - 53.9 %   MCV 78.4 (L) 80.0 - 100.0 fL   MCH 25.6 (L) 26.0 - 34.0 pg   MCHC 32.7 30.0 - 36.0 g/dL   RDW 85.1 88.4 - 84.4 %   Platelet Count 397 150 - 400 K/uL   nRBC 0.0 0.0 - 0.2 %   Neutrophils Relative % 56 %   Neutro Abs 4.5 1.7 - 7.7 K/uL   Lymphocytes Relative 38 %   Lymphs Abs 3.0 0.7 - 4.0 K/uL   Monocytes Relative 5 %   Monocytes Absolute 0.4 0.1 - 1.0 K/uL   Eosinophils Relative 1 %   Eosinophils Absolute 0.1 0.0 - 0.5 K/uL   Basophils Relative 0 %   Basophils Absolute 0.0 0.0 - 0.1 K/uL   Immature Granulocytes 0 %   Abs Immature Granulocytes 0.03 0.00 - 0.07 K/uL    Comment: Performed at [facility], [address] [place], [place] [address]  CMP (Cancer Center only)     Status: None   Collection Time: 06/04/24 12:31 PM  Result Value Ref Range   Sodium 138 135 - 145 mmol/L   Potassium 3.7 3.5 - 5.1 mmol/L   Chloride 108 98 - 111 mmol/L   CO2 23 22 - 32 mmol/L    Comment: (NOTE) Elevated LDH levels may cause falsely increased CO2 results. If LDH is >2000 U/L, a  positive bias of 12% is possible.     Glucose, Bld 81 70 - 99 mg/dL    Comment: Glucose reference range applies only to samples taken after fasting for at least 8 hours.   BUN 13 6 - 20 mg/dL   Creatinine 9.01 9.55 - 1.00 mg/dL   Calcium  9.4 8.9 - 10.3 mg/dL   Total Protein 7.5 6.5 - 8.1 g/dL   Albumin 4.4 3.5 - 5.0 g/dL   AST 16 15 - 41 U/L  ALT 21 0 - 44 U/L   Alkaline Phosphatase 67 38 - 126 U/L   Total Bilirubin 0.7 0.0 - 1.2 mg/dL   GFR, Estimated >39 >39 mL/min    Comment: (NOTE) Calculated using the CKD-EPI Creatinine Equation (2021)    Anion gap 7 5 - 15    Comment: Performed at [facility], [address], [place], [place] [address]  TSH     Status: None   Collection Time: 06/04/24 12:31 PM  Result Value Ref Range   TSH 1.650 0.350 - 4.500 uIU/mL    Comment: Performed at [facility], [address], [place], [place] [address]  Sedimentation rate     Status: None   Collection Time: 06/04/24 12:31 PM  Result Value Ref Range   Sed Rate 20 0 - 22 mm/hr    Comment: Performed at [facility], [address] [place], [place] [address]  C-reactive protein     Status: None   Collection Time: 06/04/24 12:31 PM  Result Value Ref Range   CRP 0.5 <1.0 mg/dL    Comment: Performed at [facility], [address], [place], [place] [address]  Iron and Iron Binding Capacity ([facility] only)     Status: None   Collection Time: 06/04/24 12:31 PM  Result Value Ref Range   Iron 69 28 - 170 ug/dL   TIBC 599 749 - 549 ug/dL   Saturation Ratios 17 10.4 - 31.8 %   UIBC 331 148 - 442 ug/dL    Comment: Performed at [facility], [address], [place], [place] [address]  Ferritin     Status: None   Collection Time: 06/04/24 12:31 PM  Result Value Ref Range   Ferritin 262 11 - 307 ng/mL    Comment: Performed at [facility], [address] [place], [place] [address]  JAK2  V617F rfx CALR/MPL/E12-15     Status: None   Collection Time: 06/04/24 12:31 PM  Result Value Ref Range   Specimen Type Blood     Comment: CORRECTED ON 11/17 AT 1637: PREVIOUSLY REPORTED AS PERIPHERAL BLOOD SAMPLE   JAK2 V617F Result Comment     Comment: (NOTE) NEGATIVE The JAK2 V617F mutation is not detected in the provided specimen of this individual. Results should be interpreted in conjunction with clinical and other laboratory findings for the most accurate interpretation. This test was developed and its performance characteristics determined by [facility]. It has not been cleared or approved by the Food and Drug Administration.    Reflex Comment     Comment: (NOTE) Reflex to CALR Mutation Analysis, JAK2 Exon 12-15 Mutation Analysis, and MPL Mutation Analysis is indicated.    V617F Rfx CALR/MPL/E12-15 Bkgd Comment     Comment: (NOTE)    Molecular testing of blood or bone marrow is useful in the evaluation of suspected myeloproliferative neoplasms (MPN). Mutations in the JAK2, MPL, and CALR genes are present in virtually all MPNs and their presence help distinguish benign reactive processes from clonal neoplasms. These mutations are generally considered mutually exclusive, although concurrent clones have been reported in rare patients. This test will assess for the JAK2V617F (exon 14) mutation first and will reflex to CALR mutation analysis, MPL mutation analysis, and JAK2 exon 12 to 15 mutation analysis if the JAK2V617F mutation is negative.    The JAK2 (Janus kinase 2) gene encodes for a non-receptor protein tyrosine kinase that activates cytokine and growth factor signaling.  The V617F (c.1849 G>T) mutation results in constitutive activation of JAK2 and downstream STAT5 and ERK signaling. The V617F mutation is observed in approximately 95% of polycythemia vera (PV), 60% of essential thromboc ythemia (ET) and primary myelofibrosis (PMF). It is also infrequently  present (3-5%) in myelodysplastic syndrome, chronic myelomonocytic leukemia, and other atypical chronic myeloid disorders. A small percentage of JAK2 mutation positive patients (3.3%) contain other non-V617F mutations within exons 12 to 15. In particular, mutations in exon 12 of JAK2 have been described in approximately 3% of patients with PV. JAK2 allele burden correlates with clinical phenotype, with low levels of mutant allele characterized by thrombocytosis, intermediate levels with erythrocytosis, and high mutant allele burden correlating with enhanced myelopoiesis of the BM, leukocytosis, increasing spleen size, and circulating CD34-positive cells.    The CALR (Calreticulin) gene encodes for a multifunctional calcium -binding protein involved in many cellular activities such as growth, proliferation, adhesion, and programmed cell death. Among patients with JAK2 negative MPNs, CALR are found in a pproximately 70% of patients with JAK2-negative essential thrombocythemia (ET) and 60-88% of patients with JAK2-negative primary myelofibrosis(PMF). Only a minority of patients (approximately 8%) with myelodysplasia have mutations in the CALR gene. CALR mutations are rarely detected in patients with de novo acute myeloid leukemia, chronic myelogenous leukemia, lymphoid leukemia, or solid tumors. CALR mutations are not detected in polycythemia and generally appear to be mutually exclusive with JAK2 mutations and MPL mutations. The majority of mutational changes involve a variety of insertion deletion mutations in exon 9 of the calreticulin gene: approximately 53% of all CALR mutations are a 52 bp deletion (type-1) while the second most prevalent mutation (approximately 32%) contains a 5 bp insertion (type-2). Other mutations (non-type 1 or type 2) are seen in a small minority of cases. CALR mutations in PMF tend to be with a favorable prognosis compared to JAK2 V617F trw automotive, whereas  primary myelofibrosis negative for CALR, JAK2 V617F and MPL mutations (so-called triple negative) is associated with a poor prognosis and shorter survival.    The MPL (myeloproliferative leukemia virus oncogene) gene encodes the thrombopoietin receptor which regulates hematopoiesis and megakaryopoiesis. Activating MPL mutations are associated with a subset of myeloproliferative neoplasms and acute megakaryoblastic leukemia. MPL W515 mutations are present in approximately 5-8% of patients with primary myelofibrosis (PMF) and 1-4% of patients with essential thrombocythemia (ET). The S505 mutation is detected in patients with hereditary thrombocythemia.    Limitations    This assay has a sensitivity of approximately 1% VAF for JAK2 V617F, 2.5% VAF for other mutations in JAK2 exons 12 to 15, CALR mutations, and MPL mutations. Deletions in JAK2 up to 6 bp and insertions up to 34 bp have been detected in validation studies. Deletions in CALR up to 70 bp and i nsertions up to 12 bp have been detected in validation studies.    Method based next generation sequencing.     Comment: Comment [employer]    References Comment     Comment: (NOTE) Alghasham N, Alnouri Y, Abalkhail H, Collier RAMAN. Detection of mutations in JAK2 exons 12-15 by [employer] sequencing. Int J Lab Hematol. 2016 Feb;38(1):34-41. doi: 10.1111/ijlh.87574. Epub 2015 Sep 11. PMID: 73638915. Glorine ISLE, Cheryll LABOR, Hasserjian R, Omega PARAS, Borowitz MJ, Ladora Campanile MM, [place] CD, Cazzola M, Vardiman JW. The 2016 revision to the World Health Organization classification of myeloid neoplasms and acute leukemia. Blood. 2016 May 19;127(20):2391-405. doi: 10.1182/blood-2016-03-643544. Epub 2016 Apr 11. PMID: 72930745. Honor LELON Glennie VEAR Laurita OLEGARIO [facility], Laurita PLOUGH,  Feliz RAMAN, Albitar M. Mutation profile of JAK2 transcripts in patients with chronic myeloproliferative neoplasias. J Mol Diagn. 2009 Jan;11(1):49-53.doi: 10.2353/jmoldx.2009.080114.  Epub 2008 Dec 12. PMID: 80925404; PMCID: EFR7392434. NCCN Clinical Practice Guidelines in Oncology (NCCN Guidelines) Myeloproliferative Neoplasms Version 3.2022 - March 08, 2021. Swerdlow SH, programmer, multimedia. WHO classif ication of Tumours of Haematopoietic and Lymphoid Tissues. 4th edn. Epifanio, [place]: Geologist, Engineering for [employer] on [employer]; 2017. Tefferi A. Primary myelofibrosis: 2021 update on diagnosis, risk-stratification and management. Am J Hematol. 2021 Jan;96(1):145-162. doi: 10.1002/ajh.26050. Epub 2020 Dec 2. PMID: 66802950. Vainchenker W, Kralovics R. Genetic basis and molecular pathophysiology of classical myeloproliferative neoplasms. Blood. 2017 Feb 9;129(6):667-679. doi: 10.1182/blood-2016-10-695940. Epub 2016 Dec 27. PMID: 71971970.    Director Review Comment     Comment: (NOTE) Technical Component performed at [facility] Professional Component performed by: Rolan Flatten, PhD, [facility], The Director, Molecular Oncology [facility] [id number], [address] [place] [address] [phone] Performed At: [facility] [address] [place], [place] [id number] Loran Gales MDPhD Ey:[phone] Performed At: [facility] [address] [place], [place] [id number] Loran Gales MDPhD Ey:[phone]   CALR +MPL + E12-E15 (reflexed)     Status: None   Collection Time: 06/04/24 12:31 PM  Result Value Ref Range   CALR Result Comment     Comment: (NOTE) NEGATIVE No insertions or deletions were detected within the analyzed region of the calreticulin (CALR) gene. A negative result does not entirely exclude the possibility of a clonal population carrying CALR gene mutations that are not covered by this assay. Results should be interpreted in conjunction with clinical and laboratory findings for the most accurate interpretation.    MPL Result Comment     Comment: (NOTE) NEGATIVE No MPL mutation was identified in the provided specimen of this individual.  Results should be interpreted in conjunction with clinical and other laboratory findings for the most accurate interpretation.    E12-15 Result Comment     Comment: (NOTE) NEGATIVE    JAK2 mutations were not detected in exons 12, 13, 14 and 15. The G to T nucleotide change encoding the V617F mutation was not detected. This result does not rule out the presence of JAK2 mutation at a level below the detection sensitivity of this assay, the presence of other mutations outside the analyzed region of the JAK2 gene, or the presence of a myeloproliferative or other neoplasm. Result must be correlated with other clinical data for the most accurate diagnosis. Performed At: [facility] [address] [place], [place] [id number] Loran Gales MDPhD Ey:[phone]      RADIOGRAPHIC STUDIES:  I have personally reviewed the radiological images as listed and agree with the findings in the report.  MM 3D SCREENING MAMMOGRAM BILATERAL BREAST Result Date: 06/03/2024 CLINICAL DATA:  Screening. EXAM: DIGITAL SCREENING BILATERAL MAMMOGRAM WITH TOMOSYNTHESIS AND CAD TECHNIQUE: Bilateral screening digital craniocaudal and mediolateral oblique mammograms were obtained. Bilateral screening digital breast tomosynthesis was performed. The images were evaluated with computer-aided detection. COMPARISON:  Previous exam(s). ACR Breast Density Category b: There are scattered areas of fibroglandular density. FINDINGS: There are no findings suspicious for malignancy. Findings consistent with reduction mammoplasty noted in both breasts. IMPRESSION: No mammographic evidence of malignancy. A result letter of this screening mammogram will be mailed directly to the patient. RECOMMENDATION: Screening mammogram in one year. (Code:SM-B-01Y) BI-RADS CATEGORY  2: Benign. Electronically Signed   By: Rosina Gelineau M.D.   On: 06/03/2024 12:37  Orders Placed This Encounter  Procedures   CBC with Differential (Cancer  Center Only)    Standing Status:   Future    Expected Date:   10/06/2024    Expiration Date:   01/04/2025   Iron and Iron Binding Capacity ([facility] only)    Standing Status:   Future    Expected Date:   10/06/2024    Expiration Date:   01/04/2025   Ferritin    Standing Status:   Future    Expected Date:   10/06/2024    Expiration Date:   01/04/2025     Future Appointments  Date Time Provider Department Center  10/06/2024  9:00 AM [facility] None  10/06/2024  9:30 AM Teela Narducci, Chinita, MD [facility] None    This document was completed utilizing speech recognition software. Grammatical errors, random word insertions, pronoun errors, and incomplete sentences are an occasional consequence of this system due to software limitations, ambient noise, and hardware issues. Any formal questions or concerns about the content, text or information contained within the body of this dictation should be directly addressed to the provider for clarification.  "

## 2024-07-16 ENCOUNTER — Encounter: Payer: Self-pay | Admitting: Oncology

## 2024-07-16 NOTE — Assessment & Plan Note (Addendum)
 Chronic thrombocytosis with platelet counts slightly elevated, ranging from 400,000 to 435,000.   No significant thrombotic events except a post-surgical hematoma.  Differential diagnoses considered included reactive thrombocytosis due to iron deficiency, inflammation, splenic dysfunction, thyroid  dysfunction, or a bone marrow mutation.    On her consultation with us  on 06/04/2024, labs actually showed normal platelet count of 397,000.  White count normal at 8000 with normal differential, hemoglobin 13.3, MCV 78.4.  Iron studies were well within normal limits.  ESR, CRP, TSH were all within normal limits.  JAK2 V617F mutation analysis, followed by reflex testing to include CALR, MPL, exon 12-15 mutations were all negative.  No evidence of primary myeloproliferative neoplasm.    Current platelet count does not necessitate any hematological intervention.   - Continue multivitamin once daily. - Scheduled follow-up appointment in March to recheck blood counts. - Will discharge from office if blood counts remain normal at follow-up.

## 2024-10-06 ENCOUNTER — Inpatient Hospital Stay: Admitting: Oncology

## 2024-10-06 ENCOUNTER — Inpatient Hospital Stay
